# Patient Record
Sex: Female | Born: 1960 | Race: Black or African American | Hispanic: No | Marital: Single | Smoking: Never smoker
Health system: Southern US, Community
[De-identification: ages and names within clinical notes are randomized; demographics above are authoritative.]

## PROBLEM LIST (undated history)

## (undated) DIAGNOSIS — I10 Essential (primary) hypertension: Secondary | ICD-10-CM

## (undated) DIAGNOSIS — E119 Type 2 diabetes mellitus without complications: Secondary | ICD-10-CM

## (undated) DIAGNOSIS — E785 Hyperlipidemia, unspecified: Secondary | ICD-10-CM

## (undated) HISTORY — DX: Type 2 diabetes mellitus without complications: E11.9

## (undated) HISTORY — DX: Hyperlipidemia, unspecified: E78.5

## (undated) HISTORY — PX: CHOLECYSTECTOMY: SHX55

---

## 1997-08-31 ENCOUNTER — Ambulatory Visit (HOSPITAL_COMMUNITY): Admission: RE | Admit: 1997-08-31 | Discharge: 1997-08-31 | Payer: Self-pay | Admitting: Family Medicine

## 1999-12-11 ENCOUNTER — Other Ambulatory Visit: Admission: RE | Admit: 1999-12-11 | Discharge: 1999-12-11 | Payer: Self-pay | Admitting: Internal Medicine

## 1999-12-15 ENCOUNTER — Encounter: Payer: Self-pay | Admitting: Internal Medicine

## 1999-12-15 ENCOUNTER — Encounter: Admission: RE | Admit: 1999-12-15 | Discharge: 1999-12-15 | Payer: Self-pay | Admitting: Internal Medicine

## 2000-12-28 ENCOUNTER — Other Ambulatory Visit: Admission: RE | Admit: 2000-12-28 | Discharge: 2000-12-28 | Payer: Self-pay | Admitting: Internal Medicine

## 2001-06-13 ENCOUNTER — Encounter: Admission: RE | Admit: 2001-06-13 | Discharge: 2001-09-11 | Payer: Self-pay | Admitting: Internal Medicine

## 2001-06-14 ENCOUNTER — Ambulatory Visit (HOSPITAL_COMMUNITY): Admission: RE | Admit: 2001-06-14 | Discharge: 2001-06-14 | Payer: Self-pay | Admitting: Podiatry

## 2001-06-14 ENCOUNTER — Encounter: Payer: Self-pay | Admitting: Podiatry

## 2003-10-01 ENCOUNTER — Emergency Department (HOSPITAL_COMMUNITY): Admission: EM | Admit: 2003-10-01 | Discharge: 2003-10-01 | Payer: Self-pay | Admitting: Unknown Physician Specialty

## 2004-02-04 ENCOUNTER — Ambulatory Visit (HOSPITAL_COMMUNITY): Admission: RE | Admit: 2004-02-04 | Discharge: 2004-02-04 | Payer: Self-pay | Admitting: Podiatry

## 2004-08-16 ENCOUNTER — Encounter: Admission: RE | Admit: 2004-08-16 | Discharge: 2004-08-16 | Payer: Self-pay | Admitting: Internal Medicine

## 2004-09-16 ENCOUNTER — Encounter: Admission: RE | Admit: 2004-09-16 | Discharge: 2004-09-16 | Payer: Self-pay | Admitting: Specialist

## 2005-05-11 HISTORY — PX: BACK SURGERY: SHX140

## 2005-05-19 ENCOUNTER — Encounter: Admission: RE | Admit: 2005-05-19 | Discharge: 2005-05-19 | Payer: Self-pay | Admitting: Internal Medicine

## 2005-06-27 ENCOUNTER — Emergency Department (HOSPITAL_COMMUNITY): Admission: EM | Admit: 2005-06-27 | Discharge: 2005-06-27 | Payer: Self-pay | Admitting: Emergency Medicine

## 2005-07-20 ENCOUNTER — Encounter: Admission: RE | Admit: 2005-07-20 | Discharge: 2005-07-20 | Payer: Self-pay | Admitting: Gastroenterology

## 2006-01-20 ENCOUNTER — Ambulatory Visit (HOSPITAL_COMMUNITY): Admission: RE | Admit: 2006-01-20 | Discharge: 2006-01-21 | Payer: Self-pay | Admitting: Neurosurgery

## 2006-01-20 ENCOUNTER — Encounter (INDEPENDENT_AMBULATORY_CARE_PROVIDER_SITE_OTHER): Payer: Self-pay | Admitting: *Deleted

## 2006-10-12 ENCOUNTER — Ambulatory Visit (HOSPITAL_COMMUNITY): Admission: RE | Admit: 2006-10-12 | Discharge: 2006-10-12 | Payer: Self-pay | Admitting: Gastroenterology

## 2006-10-15 ENCOUNTER — Other Ambulatory Visit: Admission: RE | Admit: 2006-10-15 | Discharge: 2006-10-15 | Payer: Self-pay | Admitting: Obstetrics and Gynecology

## 2006-12-06 ENCOUNTER — Ambulatory Visit (HOSPITAL_COMMUNITY): Admission: RE | Admit: 2006-12-06 | Discharge: 2006-12-06 | Payer: Self-pay | Admitting: Obstetrics and Gynecology

## 2006-12-13 ENCOUNTER — Encounter (INDEPENDENT_AMBULATORY_CARE_PROVIDER_SITE_OTHER): Payer: Self-pay | Admitting: General Surgery

## 2006-12-13 ENCOUNTER — Ambulatory Visit (HOSPITAL_COMMUNITY): Admission: RE | Admit: 2006-12-13 | Discharge: 2006-12-13 | Payer: Self-pay | Admitting: General Surgery

## 2007-03-10 ENCOUNTER — Encounter: Admission: RE | Admit: 2007-03-10 | Discharge: 2007-03-10 | Payer: Self-pay | Admitting: Neurosurgery

## 2007-04-24 ENCOUNTER — Emergency Department (HOSPITAL_COMMUNITY): Admission: EM | Admit: 2007-04-24 | Discharge: 2007-04-24 | Payer: Self-pay | Admitting: Emergency Medicine

## 2008-08-04 ENCOUNTER — Emergency Department (HOSPITAL_COMMUNITY): Admission: EM | Admit: 2008-08-04 | Discharge: 2008-08-04 | Payer: Self-pay | Admitting: Emergency Medicine

## 2008-10-16 ENCOUNTER — Encounter: Admission: RE | Admit: 2008-10-16 | Discharge: 2008-10-16 | Payer: Self-pay | Admitting: Neurosurgery

## 2009-05-08 ENCOUNTER — Emergency Department (HOSPITAL_COMMUNITY): Admission: EM | Admit: 2009-05-08 | Discharge: 2009-05-08 | Payer: Self-pay | Admitting: Emergency Medicine

## 2009-05-15 ENCOUNTER — Encounter: Admission: RE | Admit: 2009-05-15 | Discharge: 2009-05-15 | Payer: Self-pay | Admitting: Neurosurgery

## 2010-08-21 LAB — DIFFERENTIAL
Basophils Absolute: 0 10*3/uL (ref 0.0–0.1)
Basophils Relative: 0 % (ref 0–1)
Eosinophils Absolute: 0.1 10*3/uL (ref 0.0–0.7)
Eosinophils Relative: 1 % (ref 0–5)
Lymphocytes Relative: 21 % (ref 12–46)
Lymphs Abs: 2 10*3/uL (ref 0.7–4.0)
Monocytes Absolute: 0.7 10*3/uL (ref 0.1–1.0)
Monocytes Relative: 8 % (ref 3–12)
Neutro Abs: 6.7 10*3/uL (ref 1.7–7.7)
Neutrophils Relative %: 71 % (ref 43–77)

## 2010-08-21 LAB — URINALYSIS, ROUTINE W REFLEX MICROSCOPIC
Bilirubin Urine: NEGATIVE
Glucose, UA: NEGATIVE mg/dL
Hgb urine dipstick: NEGATIVE
Ketones, ur: NEGATIVE mg/dL
Nitrite: NEGATIVE
Protein, ur: NEGATIVE mg/dL
Specific Gravity, Urine: 1.01 (ref 1.005–1.030)
Urobilinogen, UA: 1 mg/dL (ref 0.0–1.0)
pH: 6 (ref 5.0–8.0)

## 2010-08-21 LAB — CBC
HCT: 38.8 % (ref 36.0–46.0)
Hemoglobin: 13.1 g/dL (ref 12.0–15.0)
MCHC: 33.9 g/dL (ref 30.0–36.0)
MCV: 91.3 fL (ref 78.0–100.0)
Platelets: 268 10*3/uL (ref 150–400)
RBC: 4.26 MIL/uL (ref 3.87–5.11)
RDW: 13.4 % (ref 11.5–15.5)
WBC: 9.5 10*3/uL (ref 4.0–10.5)

## 2010-08-21 LAB — COMPREHENSIVE METABOLIC PANEL
ALT: 31 U/L (ref 0–35)
AST: 31 U/L (ref 0–37)
Albumin: 4 g/dL (ref 3.5–5.2)
Alkaline Phosphatase: 68 U/L (ref 39–117)
BUN: 20 mg/dL (ref 6–23)
CO2: 28 mEq/L (ref 19–32)
Calcium: 9.8 mg/dL (ref 8.4–10.5)
Chloride: 102 mEq/L (ref 96–112)
Creatinine, Ser: 0.83 mg/dL (ref 0.4–1.2)
GFR calc Af Amer: >60 mL/min (ref >60)
GFR calc non Af Amer: >60 mL/min (ref >60)
Glucose, Bld: 115 mg/dL — ABNORMAL HIGH (ref 70–99)
Potassium: 4.3 mEq/L (ref 3.5–5.1)
Sodium: 137 mEq/L (ref 135–145)
Total Bilirubin: 0.5 mg/dL (ref 0.3–1.2)
Total Protein: 7.4 g/dL (ref 6.0–8.3)

## 2010-08-21 LAB — POCT I-STAT, CHEM 8
BUN: 23 mg/dL (ref 6–23)
Calcium, Ion: 1.21 mmol/L (ref 1.12–1.32)
Chloride: 103 mEq/L (ref 96–112)
Creatinine, Ser: 0.9 mg/dL (ref 0.4–1.2)
Glucose, Bld: 111 mg/dL — ABNORMAL HIGH (ref 70–99)
HCT: 40 % (ref 36.0–46.0)
Hemoglobin: 13.6 g/dL (ref 12.0–15.0)
Potassium: 4.4 mEq/L (ref 3.5–5.1)
Sodium: 137 mEq/L (ref 135–145)
TCO2: 30 mmol/L (ref 0–100)

## 2010-08-21 LAB — PREGNANCY, URINE: Preg Test, Ur: NEGATIVE

## 2010-09-23 NOTE — Op Note (Signed)
Ashley Crane              ACCOUNT NO.:  [id number]   MEDICAL RECORD NO.:  [id number]          PATIENT TYPE:  AMB   LOCATION:  SDS                          FACILITY:  [facility]   PHYSICIAN:  Cherylynn Ridges, M.D.    DATE OF BIRTH:  14-Jan-1961   DATE OF PROCEDURE:  12/13/2006  DATE OF DISCHARGE:                               OPERATIVE REPORT   PREOPERATIVE DIAGNOSIS:  Symptomatic biliary dyskinesia.   POSTOPERATIVE DIAGNOSIS:  Symptomatic biliary dyskinesia.   PROCEDURE:  Laparoscopic cholecystectomy with intraoperative  cholangiogram.   SURGEON:  Cherylynn Ridges, M.D.   ASSISTANT:  Currie Paris, M.D.   ANESTHESIA:  General endotracheal.   ESTIMATED BLOOD LOSS:  Less than 20 mL.   COMPLICATIONS:  None.   CONDITION:  Stable.   FINDINGS:  The patient had some adhesions to the body and infundibulum  of the gallbladder indicative of some chronic inflammatory process.  The  cholangiogram was normal.   INDICATIONS FOR OPERATION:  The patient is a 50 year old with biliary  dyskinesia and symptoms, who now comes in for an elective laparoscopic  cholecystectomy.   OPERATION:  The patient was taken to the operating room and placed on  the table in supine position.  After an adequate endotracheal anesthetic  was administered, she was prepped and draped in the usual sterile  manner, exposing the midline in the right upper quadrant.   A supraumbilical curvilinear incision was made using a #11 blade.  It  was taken down to the midline fascia.  Once the fascia was identified, a  15 blade was used to make an incision in the fascia, and then  subsequently the edges grasped with Kocher clamps.  We then tented up on  the fascia, and then bluntly dissected down into the peritoneal cavity  using a Kelly clamp.  Once this was done, a Hasson cannula was passed  into the fascial opening after a pursestring suture of 0 Vicryl was  placed.  The pursestring suture held the Hasson cannula  in place as we  insufflated the abdomen with carbon dioxide gas up to a maximal intra-  abdominal pressure of 15 mmHg.   Once this was done, the patient was placed in reverse Trendelenburg, and  2 right costal margin 5-mm cannulae and a subxiphoid 12-mm cannula were  passed under direct vision.  Her left side was tilted down.   The dome of the gallbladder was grasped using a ratcheted grasper  through the lateral mass 5-mm cannula.  This exposed adhesions to the  body and infundibulum, which were bluntly and sharply taken down with a  dissector and cautery.  We exposed the peritoneum overlying the triangle  of Calot and the hepatoduodenal triangle.  Then we isolated the cystic  duct and the cystic artery.  A clip was placed along the gallbladder  side of the cystic duct.  Then a cholecystodochotomy made, through which  a Cook catheter with some difficulty was passed in order to perform a  cholangiogram.  The cholangiogram showed good flow into the duodenum,  good proximal filling, no ductal dilatation  and no filling defects.   We removed the clip securing the cholangiocatheter in place and clipped  the distal cystic duct times 2 and transected it.  The cholangiogram  also did show that the patient had a long cystic duct remnant.   The cystic duct and artery were identified and then endoclipped  proximally x2 and distally x2 and then transected.  There was a  posterior branch also, which was identified and clipped.  We dissected  off the gallbladder from its bed with minimal difficulty, then brought  it out through the supraumbilical fascial site with minimal difficulty  and without needing an EndoCatch bag.  All counts were correct at that  point.  We irrigated the gallbladder bed with saline.  There was minimal  bleeding.  We aspirated above the liver all gas and all fluids, and then  removed all cannulae.   The supraumbilical fascial site was closed using the pursestring suture,   which was in place.  We closed all skin sites using a running  subcuticular stitch of 4-0 Vicryl, with the exception of the lateral-  most cannula sites, which were closed with Dermabond.  Marcaine 0.25%  with epi was injected at all sites.  All needle counts, sponge counts  and instrument counts were correct.  Sterile dressings were applied.      Cherylynn Ridges, M.D.  Electronically Signed     JOW/MEDQ  D:  12/13/2006  T:  12/13/2006  Job:  [id number]   cc:   Ashley Crane, M.D.

## 2010-09-26 NOTE — Op Note (Signed)
Ashley Crane, Ashley Crane              ACCOUNT NO.:  [id number]   MEDICAL RECORD NO.:  [id number]          PATIENT TYPE:  AMB   LOCATION:  SDS                          FACILITY:  [facility]   PHYSICIAN:  Donalee Citrin, M.D.        DATE OF BIRTH:  1960-11-20   DATE OF PROCEDURE:  01/20/2006  DATE OF DISCHARGE:                                 OPERATIVE REPORT   PREOPERATIVE DIAGNOSIS:  Left L4-L5 radiculopathy from cystic mass, L4-5,  left.   PROCEDURES:  Decompressive laminectomy, L4-5, left, with microscopic  dissection of the left L4 and L5 nerve roots, and microscopic cystectomy.   SURGEON:  Donalee Citrin, M.D.   ASSISTANT:  Tia Alert, MD.   ANESTHESIA:  General endotracheal.   FINDINGS:  A large synovial cyst coming off the inferior aspect of the L4  nerve root on the superior aspect of the facet complex, displacing the L4  nerve root superiorly, the thecal sac medially, and extending down to the  proximal L5 nerve root inferiorly.   HISTORY OF PRESENT ILLNESS:  The patient is a very pleasant 50 year old  female, who sustained a work-related injury in a fall, and has had back and  severe left leg pain with radiculopathy down to the top of the foot and the  big toe, consistent with the L5 nerve root pattern.  The patient failed all  forms of conservative treatment with physical therapy, epidural steroid  injections.  Preoperative imaging was very unusual.  MRI scan showed a dark,  cystic-appearing mass in the proximal aspect of the left L4 foramen just  inferior to the pedicle.  This was further worked up with CT myelography,  which implied some communication with the spinal fluid.  This space was  filled with CSF and Omnipaque.  This was originally diagnosed as a  neurofibroma.  This subsequently underwent contrasted MRI scan, which did  not show any enhancement, so therefore felt not to be a neurofibroma, but  more likely represents either a synovial cyst or a perineural cyst.  Due  to  the patient's persistent symptoms and failure of conservative treatment, the  patient was recommended decompressive laminectomy, exploration and removal  of cyst, if possible.  The risks and benefits were thoroughly explained to  the patient.  She understood and agreed to proceed forward.   DESCRIPTION OF PROCEDURE:  The patient was brought to the OR and was induced  under general anesthesia and was positioned prone on the Wilson frame.  The  back was prepped and draped in the usual sterile fashion.  Preoperative x-  ray localized the L5-S1 interspace. So the incision was made superior to  this after infiltration of 10 cc lidocaine with epi, and Bovie  electrocautery was used to take the dissection down through the subcutaneous  tissues.  Subperiosteal dissection was carried out onto the laminae of L4  and L5 bilaterally.  A self-retaining retractor was placed.  Intraoperative  x-ray confirmed localization of the L4-5 disk space.  So, then the spinous  process at L4 was removed and central decompression was carried out  to gain  access around this cyst, with the idea that this may very well be a  perineural cyst.  We needed to have enough dural exposure to investigate,  explore and possibly excise and repair.  The laminotomy was carried up all  the way to the superior aspect of the L4 lamina.  The L4 pedicle was  identified.  The L4 nerve root was then identified coming off the pedicle.  The cyst was also palpated and expressed and seen coming off the inferior  aspect of the facet complex; so, at this point, the L5 nerve root was  identified off the L5 pedicle.  The disk space was identified.  The superior  and inferior margins of the cyst were identified.  The cyst was noted to be  kind of grayish in color, consistent with a synovial cyst.  A plane was  developed on the inferior aspect of the 4 root, as well as the lateral  aspect of the thecal sac, and this plane was developed with a  4 Penfield.  Then, it was removed, felt not to be coming off the 4 root, and no pedicle  on the thecal sac or dura was appreciated.  So, working along the 4 root  with the 2-mm Kerrison, on the inferior aspect of the 4 root, the cyst was  underbitten and the undersurface of the lateral facet complex was  underbitten.  The cyst contents were immediately expressed, and this was  noted to be a dark yellow, cottage cheese-appearing substance consistent  with a dystrophic or degenerated synovial cyst, and this was all removed in  piecemeal fashion.  The cyst was then completely excised and sent for final  pathology to confirm the characterization of synovial cyst.  The  undersurface of the facet was palpated and scraped, and all the cystic  membrane was dissected free.  Also, the undersurface of the thecal sac  laterally was also investigated.  There was noted to be a densely adherent  fibrous band.  It was unclear whether this represented some epidural veins,  or possibly residual cyst membrane; but it was densely adherent to the dura  with a small pedicle, and this was all teased away and bipolar'd.  The disk  space was identified and explored and the disk space was noted to be not  compressive.  Then, the wound was copiously irrigated.  Meticulous  hemostasis was maintained.  Gelfoam was laid on top of the dura.  Muscle and  fascia were reapproximated with interrupted Vicryl, and the skin was closed  with running 4-0 subcuticular. Benzoin and Steri-Strips were applied.  The  patient went to the recovery room in stable condition.  At the end of the  case, all sponge and needle counts were correct.           ______________________________  Donalee Citrin, M.D.     GC/MEDQ  D:  01/20/2006  T:  01/20/2006  Job:  [id number]

## 2010-09-27 ENCOUNTER — Inpatient Hospital Stay (INDEPENDENT_AMBULATORY_CARE_PROVIDER_SITE_OTHER)
Admission: RE | Admit: 2010-09-27 | Discharge: 2010-09-27 | Disposition: A | Discharge status: Home / Self Care | Payer: Self-pay | Source: Ambulatory Visit | Attending: Family Medicine | Admitting: Family Medicine

## 2010-09-27 DIAGNOSIS — S139XXA Sprain of joints and ligaments of unspecified parts of neck, initial encounter: Secondary | ICD-10-CM

## 2010-09-29 ENCOUNTER — Emergency Department (HOSPITAL_COMMUNITY): Payer: No Typology Code available for payment source

## 2010-09-29 ENCOUNTER — Emergency Department (HOSPITAL_COMMUNITY)
Admission: EM | Admit: 2010-09-29 | Discharge: 2010-09-29 | Disposition: A | Discharge status: Home / Self Care | Payer: No Typology Code available for payment source | Attending: Emergency Medicine | Admitting: Emergency Medicine

## 2010-09-29 DIAGNOSIS — M25519 Pain in unspecified shoulder: Secondary | ICD-10-CM | POA: Insufficient documentation

## 2010-09-29 DIAGNOSIS — E669 Obesity, unspecified: Secondary | ICD-10-CM | POA: Insufficient documentation

## 2010-09-29 DIAGNOSIS — I10 Essential (primary) hypertension: Secondary | ICD-10-CM | POA: Insufficient documentation

## 2010-09-29 DIAGNOSIS — M542 Cervicalgia: Secondary | ICD-10-CM | POA: Insufficient documentation

## 2010-09-29 DIAGNOSIS — R079 Chest pain, unspecified: Secondary | ICD-10-CM | POA: Insufficient documentation

## 2010-09-29 DIAGNOSIS — T148XXA Other injury of unspecified body region, initial encounter: Secondary | ICD-10-CM | POA: Insufficient documentation

## 2011-02-23 LAB — COMPREHENSIVE METABOLIC PANEL
ALT: 26
AST: 23
Albumin: 3.5
Alkaline Phosphatase: 59
BUN: 12
CO2: 24
Calcium: 9.3
Chloride: 105
Creatinine, Ser: 0.72
GFR calc Af Amer: >60
GFR calc non Af Amer: >60
Glucose, Bld: 141 — ABNORMAL HIGH
Potassium: 3.6
Sodium: 136
Total Bilirubin: 0.5
Total Protein: 6.8

## 2011-02-23 LAB — DIFFERENTIAL
Basophils Absolute: 0
Basophils Relative: 0
Eosinophils Absolute: 0.1
Eosinophils Relative: 1
Lymphocytes Relative: 30
Lymphs Abs: 2.1
Monocytes Absolute: 0.4
Monocytes Relative: 6
Neutro Abs: 4.4
Neutrophils Relative %: 63

## 2011-02-23 LAB — CBC
HCT: 33 — ABNORMAL LOW
Hemoglobin: 11.3 — ABNORMAL LOW
MCHC: 34.2
MCV: 90
Platelets: 301
RBC: 3.67 — ABNORMAL LOW
RDW: 13.8
WBC: 7

## 2011-02-23 LAB — HCG, SERUM, QUALITATIVE: Preg, Serum: NEGATIVE

## 2013-02-24 ENCOUNTER — Emergency Department (HOSPITAL_COMMUNITY)
Admission: EM | Admit: 2013-02-24 | Discharge: 2013-02-25 | Disposition: A | Discharge status: Home / Self Care | Payer: Medicare Other | Attending: Emergency Medicine | Admitting: Emergency Medicine

## 2013-02-24 ENCOUNTER — Emergency Department (HOSPITAL_COMMUNITY): Payer: Medicare Other

## 2013-02-24 ENCOUNTER — Encounter (HOSPITAL_COMMUNITY): Payer: Self-pay | Admitting: Emergency Medicine

## 2013-02-24 DIAGNOSIS — H53149 Visual discomfort, unspecified: Secondary | ICD-10-CM | POA: Insufficient documentation

## 2013-02-24 DIAGNOSIS — I10 Essential (primary) hypertension: Secondary | ICD-10-CM | POA: Insufficient documentation

## 2013-02-24 DIAGNOSIS — R51 Headache: Secondary | ICD-10-CM | POA: Insufficient documentation

## 2013-02-24 DIAGNOSIS — R519 Headache, unspecified: Secondary | ICD-10-CM

## 2013-02-24 HISTORY — DX: Essential (primary) hypertension: I10

## 2013-02-24 MED ORDER — DIPHENHYDRAMINE HCL 50 MG/ML IJ SOLN
25.0000 mg | Freq: Once | INTRAMUSCULAR | Status: AC
  Administered 2013-02-24: 25 mg via INTRAVENOUS
  Filled 2013-02-24: qty 1

## 2013-02-24 MED ORDER — SODIUM CHLORIDE 0.9 % IV BOLUS (SEPSIS)
1000.0000 mL | Freq: Once | INTRAVENOUS | Status: AC
  Administered 2013-02-24: 1000 mL via INTRAVENOUS

## 2013-02-24 MED ORDER — DEXAMETHASONE SODIUM PHOSPHATE 10 MG/ML IJ SOLN
10.0000 mg | Freq: Once | INTRAMUSCULAR | Status: AC
  Administered 2013-02-24: 10 mg via INTRAVENOUS
  Filled 2013-02-24: qty 1

## 2013-02-24 MED ORDER — METOCLOPRAMIDE HCL 5 MG/ML IJ SOLN
10.0000 mg | Freq: Once | INTRAMUSCULAR | Status: AC
  Administered 2013-02-24: 10 mg via INTRAVENOUS
  Filled 2013-02-24: qty 2

## 2013-02-24 NOTE — ED Notes (Signed)
Patient transported to CT 

## 2013-02-24 NOTE — ED Notes (Signed)
Pt presents w/ c/o HTN, +nausea, migraine headache since taking one of her sister's oxycodone on the September 30.  Pt reports not "feeling right" since ingestion of oxycodone.  Pt reports elevation in BP.

## 2013-02-24 NOTE — ED Provider Notes (Signed)
CSN: [id number]     Arrival date & time 02/24/13  2100 History   First MD Initiated Contact with Patient 02/24/13 2204     Chief Complaint  Patient presents with  . Hypertension   (Consider location/radiation/quality/duration/timing/severity/associated sxs/prior Treatment) Patient is a 52 y.o. female presenting with headaches.  Headache Pain location:  Frontal Quality:  Dull Radiates to:  Does not radiate Pain severity now: severe. Onset quality:  Gradual Duration: "this week" Timing:  Intermittent Progression:  Unchanged Chronicity:  New Context: bright light   Relieved by: excedrin. Worsened by:  Light Associated symptoms: photophobia   Associated symptoms: no abdominal pain, no blurred vision, no congestion, no cough, no diarrhea, no fever, no nausea and no vomiting     Past Medical History  Diagnosis Date  . Hypertension    Past Surgical History  Procedure Laterality Date  . Back surgery  2007  . Cholecystectomy     No family history on file. History  Substance Use Topics  . Smoking status: Never Smoker   . Smokeless tobacco: Not on file  . Alcohol Use: No   OB History   Grav Para Term Preterm Abortions TAB SAB Ect Mult Living                 Review of Systems  Constitutional: Negative for fever.  HENT: Negative for congestion.   Eyes: Positive for photophobia. Negative for blurred vision.  Respiratory: Negative for cough and shortness of breath.   Cardiovascular: Negative for chest pain.  Gastrointestinal: Negative for nausea, vomiting, abdominal pain and diarrhea.  Neurological: Positive for headaches.  All other systems reviewed and are negative.    Allergies  Review of patient's allergies indicates not on file.  Home Medications  No current outpatient prescriptions on file. BP 159/81  Pulse 75  Temp(Src) 98.9 F (37.2 C) (Oral)  Resp 20  SpO2 100% Physical Exam  Nursing note and vitals reviewed. Constitutional: She is oriented to  person, place, and time. She appears well-developed and well-nourished. No distress.  HENT:  Head: Normocephalic and atraumatic.  Mouth/Throat: Oropharynx is clear and moist.  Eyes: Conjunctivae are normal. Pupils are equal, round, and reactive to light. No scleral icterus.  Neck: Neck supple.  Cardiovascular: Normal rate, regular rhythm, normal heart sounds and intact distal pulses.   No murmur heard. Pulmonary/Chest: Effort normal and breath sounds normal. No stridor. No respiratory distress. She has no rales.  Abdominal: Soft. Bowel sounds are normal. She exhibits no distension. There is no tenderness.  Musculoskeletal: Normal range of motion.  Neurological: She is alert and oriented to person, place, and time. She has normal strength. No cranial nerve deficit or sensory deficit. Coordination and gait normal. GCS eye subscore is 4. GCS verbal subscore is 5. GCS motor subscore is 6.  Skin: Skin is warm and dry. No rash noted.  Psychiatric: She has a normal mood and affect. Her behavior is normal.    ED Course  Procedures (including critical care time) Labs Review Labs Reviewed - No data to display Imaging Review Ct Head Wo Contrast  02/24/2013   CLINICAL DATA:  Frontal headache  EXAM: CT HEAD WITHOUT CONTRAST  TECHNIQUE: Contiguous axial images were obtained from the base of the skull through the vertex without intravenous contrast.  COMPARISON:  Prior MRI from 05/15/2009.  FINDINGS: There is no acute intracranial hemorrhage or infarct. No mass or midline shift. CSF containing spaces are within normal limits. No hydrocephalus. No extra-axial fluid collection. Calvarium  is intact. Orbits are normal.  Paranasal sinuses and mastoid air cells are clear.  IMPRESSION: No acute intracranial process.   Electronically Signed   By: Rise Mu M.D.   On: 02/24/2013 23:20  All radiology studies independently viewed by me.     EKG Interpretation   None       MDM   1. Headache    52  yo female complaining of intermittent headache all week.  Described as gradual onset.  No fevers, no neck supple.  Normal neuro exam.  She states headaches are atypical for her, so will check CT.  IV metoclopramide, diphenhydramine, dexamethasone, and fluids for symptoms.    Symptoms resolved.  Head CT negative.  Feel that patient is safe for discharge.  Return precautions given and PCP followup advised.      Candyce Churn, MD 02/25/13 [phone]

## 2014-06-11 ENCOUNTER — Other Ambulatory Visit: Payer: Self-pay | Admitting: Internal Medicine

## 2014-06-11 ENCOUNTER — Ambulatory Visit
Admission: RE | Admit: 2014-06-11 | Discharge: 2014-06-11 | Disposition: A | Discharge status: Home / Self Care | Payer: 59 | Source: Ambulatory Visit | Attending: Internal Medicine | Admitting: Internal Medicine

## 2014-06-11 DIAGNOSIS — M25551 Pain in right hip: Secondary | ICD-10-CM

## 2014-07-11 ENCOUNTER — Other Ambulatory Visit: Payer: Self-pay | Admitting: Internal Medicine

## 2014-07-11 DIAGNOSIS — R1031 Right lower quadrant pain: Secondary | ICD-10-CM

## 2014-07-18 ENCOUNTER — Ambulatory Visit
Admission: RE | Admit: 2014-07-18 | Discharge: 2014-07-18 | Disposition: A | Discharge status: Home / Self Care | Payer: 59 | Source: Ambulatory Visit | Attending: Internal Medicine | Admitting: Internal Medicine

## 2014-07-18 DIAGNOSIS — R1031 Right lower quadrant pain: Secondary | ICD-10-CM

## 2014-07-18 MED ORDER — IOPAMIDOL (ISOVUE-300) INJECTION 61%
125.0000 mL | Freq: Once | INTRAVENOUS | Status: AC | PRN
  Administered 2014-07-18: 125 mL via INTRAVENOUS

## 2014-08-01 ENCOUNTER — Encounter: Payer: Medicare HMO | Attending: Internal Medicine | Admitting: *Deleted

## 2014-08-01 ENCOUNTER — Encounter: Payer: Self-pay | Admitting: *Deleted

## 2014-08-01 VITALS — Ht 64.0 in | Wt 201.1 lb

## 2014-08-01 DIAGNOSIS — Z713 Dietary counseling and surveillance: Secondary | ICD-10-CM | POA: Diagnosis not present

## 2014-08-01 DIAGNOSIS — E119 Type 2 diabetes mellitus without complications: Secondary | ICD-10-CM | POA: Diagnosis not present

## 2014-08-01 NOTE — Progress Notes (Signed)
Diabetes Self-Management Education  Visit Type:  DSME  Appt. Start Time: 0800 Appt. End Time: 0930  08/01/2014  Ms. Ashley Crane, identified by name and date of birth, is a 54 y.o. female with a diagnosis of Diabetes: Type 2.  Other people present during visit:  Patient . Ms. Cheese lives with her brother and god-brother. She does most of the shopping and cooking. Visit notes from 07/09/14 indicate RX of Atorvastatin. Patient states it was not RX'd and is not at pharmacy.  ASSESSMENT  Height  (1.626 m), weight 201 lb 1.6 oz (91.218 kg). Body mass index is 34.5 kg/(m^2).  Initial Visit Information:  Are you currently following a meal plan?: No Are you taking your medications as prescribed?: Yes Are you checking your feet?: No How often do you need to have someone help you when you read instructions, pamphlets, or other written materials from your doctor or pharmacy?: 1 - Never   Psychosocial:   Patient Belief/Attitude about Diabetes: Motivated to manage diabetes Self-care barriers: None Self-management support: Doctor's office, Family, CDE visits Other persons present: Patient Patient Concerns: Nutrition/Meal planning, Healthy Lifestyle Special Needs: None Preferred Learning Style: No preference indicated Learning Readiness: Change in progress  Complications:   Last HgB A1C per patient/outside source: 12 mg/dL How often do you check your blood sugar?: 1-2 times/day Fasting Blood glucose range (mg/dL): >409 (Range 811-914 / has had medicataion change recently to address hyperglycemia) Have you had a dilated eye exam in the past 12 months?: Yes Have you had a dental exam in the past 12 months?: No  Diet Intake:  Breakfast: 3/4 C grits, turkey/pork bacon, toast strawberry jelly, fried egg (egg whites), water Lunch: cabbage, meatoaf, rice, / [place] Sub from [employer] , unsweet Tea with splenda Snack (afternoon): potato chips, kettle chips,/ [place] protein bar ,  peanuts, fig newton fiber bar Dinner: zucchini, onions, chicken,  Snack (evening): [place] salad, Ritz crackers #6, peanuts Beverage(s): water, coffee,  Exercise:  Exercise: ADL's  Individualized Plan for Diabetes Self-Management Training:   Learning Objective:  Patient will have a greater understanding of diabetes self-management. Patient education plan per assessed needs and concerns is to attend individual sessions     Education Topics Reviewed with Patient Today:  Definition of diabetes, type 1 and 2, and the diagnosis of diabetes, Factors that contribute to the development of diabetes Role of diet in the treatment of diabetes and the relationship between the three main macronutrients and blood glucose level, Food label reading, portion sizes and measuring food., Carbohydrate counting, Information on hints to eating out and maintain blood glucose control., Meal options for control of blood glucose level and chronic complications. Role of exercise on diabetes management, blood pressure control and cardiac health. Purpose and frequency of SMBG., Yearly dilated eye exam, Daily foot exams Identified and addressed patients feelings and concerns about diabetes  PATIENTS GOALS/Plan (Developed by the patient):  Nutrition: General guidelines for healthy choices and portions discussed Physical Activity: Exercise 3-5 times per week, 30 minutes per day Medications: take my medication as prescribed   Patient Instructions  Plan:  Aim for 2-3 Carb Choices per meal (30-45 grams) +/- 1 either way  Aim for 0-15 Carbs per snack if hungry  Include protein in moderation with your meals and snacks Consider reading food labels for Total Carbohydrate and Fat Grams of foods Consider  increasing your activity level by exercise for 30 minutes daily as tolerated Consider checking BG at alternate times per day as  directed by MD  continue taking medication as directed by MD  Butter substitute: [employer]  [employer] PasseyBrown, I Can't believe it's not butter Olive Oil is your best choice Diet [employer]a - NO calories, caffeine, sodium / Diet GingerAle Terrill MohrSara Lee Reduced calorie bread also reduces the amount of carbohydrates Do not get processed cheese - get real cheese from [employer] Counter Consider egg whites or EggBeaters instead of whole eggs  Check with Dr. Renae GlossShelton about Atorvastatin 40mg  1 daily    Expected Outcomes:  Demonstrated interest in learning. Expect positive outcomes  Education material provided: Living Well with Diabetes, Food planning card, My Plate, Snack sheet  If problems or questions, patient to contact team via:  Phone  Future DSME appointment: PRN

## 2014-08-01 NOTE — Patient Instructions (Addendum)
Plan:  Aim for 2-3 Carb Choices per meal (30-45 grams) +/- 1 either way  Aim for 0-15 Carbs per snack if hungry  Include protein in moderation with your meals and snacks Consider reading food labels for Total Carbohydrate and Fat Grams of foods Consider  increasing your activity level by exercise for 30 minutes daily as tolerated Consider checking BG at alternate times per day as directed by MD  continue taking medication as directed by MD  Butter substitute: Brummel & [place] PasseyBrown, I Can't believe it's not butter Olive Oil is your best choice Diet R[employer] Cola - NO calories, caffeine, sodium / Diet GingerAle Terrill MohrSara Lee Reduced calorie bread also reduces the amount of carbohydrates Do not get processed cheese - get real cheese from [employer] Counter Consider egg whites or EggBeaters instead of whole eggs  Check with Dr. Renae GlossShelton about Atorvastatin 40mg  1 daily

## 2015-02-19 DIAGNOSIS — H2513 Age-related nuclear cataract, bilateral: Secondary | ICD-10-CM | POA: Diagnosis not present

## 2015-02-19 DIAGNOSIS — E119 Type 2 diabetes mellitus without complications: Secondary | ICD-10-CM | POA: Diagnosis not present

## 2015-02-19 DIAGNOSIS — H04123 Dry eye syndrome of bilateral lacrimal glands: Secondary | ICD-10-CM | POA: Diagnosis not present

## 2015-04-18 DIAGNOSIS — E1165 Type 2 diabetes mellitus with hyperglycemia: Secondary | ICD-10-CM | POA: Diagnosis not present

## 2015-04-18 DIAGNOSIS — E785 Hyperlipidemia, unspecified: Secondary | ICD-10-CM | POA: Diagnosis not present

## 2015-04-18 DIAGNOSIS — Z6839 Body mass index (BMI) 39.0-39.9, adult: Secondary | ICD-10-CM | POA: Diagnosis not present

## 2015-04-18 DIAGNOSIS — I10 Essential (primary) hypertension: Secondary | ICD-10-CM | POA: Diagnosis not present

## 2015-04-19 DIAGNOSIS — E784 Other hyperlipidemia: Secondary | ICD-10-CM | POA: Diagnosis not present

## 2015-04-19 DIAGNOSIS — E1165 Type 2 diabetes mellitus with hyperglycemia: Secondary | ICD-10-CM | POA: Diagnosis not present

## 2015-04-19 DIAGNOSIS — I10 Essential (primary) hypertension: Secondary | ICD-10-CM | POA: Diagnosis not present

## 2015-04-22 DIAGNOSIS — Z1231 Encounter for screening mammogram for malignant neoplasm of breast: Secondary | ICD-10-CM | POA: Diagnosis not present

## 2015-07-09 ENCOUNTER — Encounter (INDEPENDENT_AMBULATORY_CARE_PROVIDER_SITE_OTHER): Payer: Self-pay

## 2015-07-18 DIAGNOSIS — E784 Other hyperlipidemia: Secondary | ICD-10-CM | POA: Diagnosis not present

## 2015-07-18 DIAGNOSIS — E785 Hyperlipidemia, unspecified: Secondary | ICD-10-CM | POA: Diagnosis not present

## 2015-07-18 DIAGNOSIS — I10 Essential (primary) hypertension: Secondary | ICD-10-CM | POA: Diagnosis not present

## 2015-07-18 DIAGNOSIS — E119 Type 2 diabetes mellitus without complications: Secondary | ICD-10-CM | POA: Diagnosis not present

## 2015-07-18 DIAGNOSIS — E1165 Type 2 diabetes mellitus with hyperglycemia: Secondary | ICD-10-CM | POA: Diagnosis not present

## 2015-07-18 DIAGNOSIS — Z6839 Body mass index (BMI) 39.0-39.9, adult: Secondary | ICD-10-CM | POA: Diagnosis not present

## 2015-10-31 DIAGNOSIS — M5430 Sciatica, unspecified side: Secondary | ICD-10-CM | POA: Diagnosis not present

## 2015-10-31 DIAGNOSIS — E1165 Type 2 diabetes mellitus with hyperglycemia: Secondary | ICD-10-CM | POA: Diagnosis not present

## 2015-10-31 DIAGNOSIS — Z23 Encounter for immunization: Secondary | ICD-10-CM | POA: Diagnosis not present

## 2015-10-31 DIAGNOSIS — Z0000 Encounter for general adult medical examination without abnormal findings: Secondary | ICD-10-CM | POA: Diagnosis not present

## 2015-10-31 DIAGNOSIS — E785 Hyperlipidemia, unspecified: Secondary | ICD-10-CM | POA: Diagnosis not present

## 2015-10-31 DIAGNOSIS — I10 Essential (primary) hypertension: Secondary | ICD-10-CM | POA: Diagnosis not present

## 2015-11-06 DIAGNOSIS — E784 Other hyperlipidemia: Secondary | ICD-10-CM | POA: Diagnosis not present

## 2015-11-06 DIAGNOSIS — E559 Vitamin D deficiency, unspecified: Secondary | ICD-10-CM | POA: Diagnosis not present

## 2015-11-06 DIAGNOSIS — Z0000 Encounter for general adult medical examination without abnormal findings: Secondary | ICD-10-CM | POA: Diagnosis not present

## 2015-11-06 DIAGNOSIS — E1165 Type 2 diabetes mellitus with hyperglycemia: Secondary | ICD-10-CM | POA: Diagnosis not present

## 2016-01-17 DIAGNOSIS — N390 Urinary tract infection, site not specified: Secondary | ICD-10-CM | POA: Diagnosis not present

## 2016-02-03 DIAGNOSIS — E1165 Type 2 diabetes mellitus with hyperglycemia: Secondary | ICD-10-CM | POA: Diagnosis not present

## 2016-02-03 DIAGNOSIS — E785 Hyperlipidemia, unspecified: Secondary | ICD-10-CM | POA: Diagnosis not present

## 2016-02-03 DIAGNOSIS — I10 Essential (primary) hypertension: Secondary | ICD-10-CM | POA: Diagnosis not present

## 2016-02-25 ENCOUNTER — Encounter (HOSPITAL_COMMUNITY): Payer: Self-pay | Admitting: *Deleted

## 2016-02-25 ENCOUNTER — Emergency Department (HOSPITAL_COMMUNITY)
Admission: EM | Admit: 2016-02-25 | Discharge: 2016-02-25 | Disposition: A | Discharge status: Home / Self Care | Payer: Medicare HMO | Attending: Emergency Medicine | Admitting: Emergency Medicine

## 2016-02-25 DIAGNOSIS — I10 Essential (primary) hypertension: Secondary | ICD-10-CM | POA: Diagnosis not present

## 2016-02-25 DIAGNOSIS — H04123 Dry eye syndrome of bilateral lacrimal glands: Secondary | ICD-10-CM | POA: Diagnosis not present

## 2016-02-25 DIAGNOSIS — E119 Type 2 diabetes mellitus without complications: Secondary | ICD-10-CM | POA: Insufficient documentation

## 2016-02-25 DIAGNOSIS — M25511 Pain in right shoulder: Secondary | ICD-10-CM | POA: Diagnosis not present

## 2016-02-25 DIAGNOSIS — Z7982 Long term (current) use of aspirin: Secondary | ICD-10-CM | POA: Diagnosis not present

## 2016-02-25 DIAGNOSIS — G8929 Other chronic pain: Secondary | ICD-10-CM | POA: Diagnosis not present

## 2016-02-25 DIAGNOSIS — H2513 Age-related nuclear cataract, bilateral: Secondary | ICD-10-CM | POA: Diagnosis not present

## 2016-02-25 DIAGNOSIS — H40013 Open angle with borderline findings, low risk, bilateral: Secondary | ICD-10-CM | POA: Diagnosis not present

## 2016-02-25 DIAGNOSIS — Z7984 Long term (current) use of oral hypoglycemic drugs: Secondary | ICD-10-CM | POA: Insufficient documentation

## 2016-02-25 DIAGNOSIS — Z79899 Other long term (current) drug therapy: Secondary | ICD-10-CM | POA: Insufficient documentation

## 2016-02-25 MED ORDER — ACETAMINOPHEN 500 MG PO TABS
1000.0000 mg | ORAL_TABLET | Freq: Once | ORAL | Status: AC
  Administered 2016-02-25: 1000 mg via ORAL
  Filled 2016-02-25: qty 2

## 2016-02-25 MED ORDER — ACETAMINOPHEN 500 MG PO TABS: 1000.0000 mg | ORAL_TABLET | Freq: Three times a day (TID) | ORAL | 0 refills | Status: AC

## 2016-02-25 NOTE — ED Triage Notes (Signed)
Patient c/o right shoulder pain x3 days and hx of same.  Patient has been told she has a bone spur in that shoulder, as well as rotator cuff injury.  Patient fell in 2007 and injured shoulder.  She discussed rotator cuff surgery with orthopedist, but that surgery was never done.  Patient has had intermittent steroid injections which occasionally helps.  Patient went to orthopedist today, but could not be seen.  She has an appt for Monday.  Patient has +2 radial pulse in RUE.  Patient has full ROM in RUE, but it increases pain.

## 2016-02-25 NOTE — ED Provider Notes (Signed)
[facility]-EMERGENCY DEPT Provider Note   CSN: [id number] Arrival date & time: 02/25/16  1735     History   Chief Complaint Chief Complaint  Patient presents with  . Shoulder Pain    HPI [name] is a 55 y.o. female.  HPI 55 year old female with a history of hypertension, hyperlipidemia, diabetes, chronic right shoulder pain secondary to what she reports that the bursitis presents ED with recurrence of her chronic right shoulder pain for approximately 3-4 weeks. Pain is been persistent and aggravated with ranging the shoulder and palpation. Patient has required steroid injections for pain relief several years ago. Was followed up by orthopedic surgery however pain had improved following that. Currently has an appointment with orthopedic surgery for next week. Patient has been placing Salon Pas with lidocaine which provides some relief. Patient denies any recent falls or trauma. Currently denies any chest pain, shortness of breath, nausea or vomiting. She denies any numbness, tingling, weakness of the right upper extremity.  Past Medical History:  Diagnosis Date  . Diabetes mellitus without complication (HCC)   . Hyperlipidemia   . Hypertension     There are no active problems to display for this patient.   Past Surgical History:  Procedure Laterality Date  . BACK SURGERY  2007  . CHOLECYSTECTOMY      OB History    No data available       Home Medications    Prior to Admission medications   Medication Sig Start Date End Date Taking? Authorizing Provider  acetaminophen (TYLENOL) 500 MG tablet Take 2 tablets (1,000 mg total) by mouth every 8 (eight) hours. Do not take more than 4000 mg of acetaminophen (Tylenol) in a 24-hour period. Please note that other medicines that you may be prescribed may have Tylenol as well. 02/25/16 03/01/16  Nira ConnPedro Eduardo Lashica Hannay, MD  aspirin-acetaminophen-caffeine (EXCEDRIN MIGRAINE) [id number] MG per tablet Take 1 tablet by mouth every 6  (six) hours as needed for pain.    Historical Provider, MD  atorvastatin (LIPITOR) 40 MG tablet Take 40 mg by mouth daily.    Historical Provider, MD  canagliflozin (INVOKANA) 300 MG TABS tablet Take 300 mg by mouth daily before breakfast.    Historical Provider, MD  metFORMIN (GLUCOPHAGE) 500 MG tablet Take 500 mg by mouth 2 (two) times daily with a meal.    Historical Provider, MD  PRESCRIPTION MEDICATION Take 1 tablet by mouth daily. Lisinopril HCTZ 10/ 25 mg    Historical Provider, MD  valsartan-hydrochlorothiazide (DIOVAN-HCT) 320-25 MG per tablet Take 1 tablet by mouth daily.    Historical Provider, MD    Family History Family History  Problem Relation Age of Onset  . Hyperlipidemia Other   . Hypertension Other   . Diabetes Other   . Heart disease Other   . Sleep apnea Other     Social History Social History  Substance Use Topics  . Smoking status: Never Smoker  . Smokeless tobacco: Never Used  . Alcohol use No     Allergies   Oxycodone hcl   Review of Systems Review of Systems Ten systems are reviewed and are negative for acute change except as noted in the HPI   Physical Exam Updated Vital Signs BP 153/75 (BP Location: Left Arm)   Pulse (!) 56   Temp 98 F (36.7 C) (Oral)   Resp 16   Ht 5\' 4"  (1.626 m)   Wt 192 lb (87.1 kg)   SpO2 99%   BMI 32.96 kg/m  Physical Exam  Constitutional: She is oriented to person, place, and time. She appears well-developed and well-nourished. No distress.  HENT:  Head: Normocephalic and atraumatic.  Right Ear: External ear normal.  Left Ear: External ear normal.  Nose: Nose normal.  Eyes: Conjunctivae and EOM are normal. No scleral icterus.  Neck: Normal range of motion and phonation normal.  Cardiovascular: Normal rate and regular rhythm.   Pulmonary/Chest: Effort normal. No stridor. No respiratory distress.  Abdominal: She exhibits no distension.  Musculoskeletal: Normal range of motion. She exhibits no edema.        Right shoulder: She exhibits tenderness. She exhibits normal range of motion, no swelling, no effusion, no crepitus, no deformity, no laceration, no pain and no spasm.       Arms: Neurological: She is alert and oriented to person, place, and time.  Skin: She is not diaphoretic.  Psychiatric: She has a normal mood and affect. Her behavior is normal.  Vitals reviewed.    ED Treatments / Results  Labs (all labs ordered are listed, but only abnormal results are displayed) Labs Reviewed - No data to display  EKG  EKG Interpretation None       Radiology No results found.  Procedures Procedures (including critical care time)  Medications Ordered in ED Medications  acetaminophen (TYLENOL) tablet 1,000 mg (1,000 mg Oral Given 02/25/16 2116)     Initial Impression / Assessment and Plan / ED Course  I have reviewed the triage vital signs and the nursing notes.  Pertinent labs & imaging results that were available during my care of the patient were reviewed by me and considered in my medical decision making (see chart for details).  Clinical Course    Low suspicion for septic arthritis.No recent trauma to suggest rotator cuff injury. Likely exacerbation of patient's prior bursitis. No indication for imaging at this time. Patient provided with by mouth Tylenol.  She already has follow-up with orthopedic surgery was instructed to keep this appointment.  Safe for discharge with strict return precautions.  Final Clinical Impressions(s) / ED Diagnoses   Final diagnoses:  Chronic right shoulder pain   Disposition: Discharge  Condition: Good  I have discussed the results, Dx and Tx plan with the patient who expressed understanding and agree(s) with the plan. Discharge instructions discussed at great length. The patient was given strict return precautions who verbalized understanding of the instructions. No further questions at time of discharge.    Discharge Medication List  as of 02/25/2016 10:06 PM    START taking these medications   Details  acetaminophen (TYLENOL) 500 MG tablet Take 2 tablets (1,000 mg total) by mouth every 8 (eight) hours. Do not take more than 4000 mg of acetaminophen (Tylenol) in a 24-hour period. Please note that other medicines that you may be prescribed may have Tylenol as well., Starting Tue 02/25/2016,  Until Sun 03/01/2016, Print        Follow Up: Ortho  On 03/02/2016 as scheduled  Andi Devon, MD [address] [place] [address] [phone]  Call  As needed      Nira Conn, MD 02/26/16 [phone]

## 2016-02-27 DIAGNOSIS — R0989 Other specified symptoms and signs involving the circulatory and respiratory systems: Secondary | ICD-10-CM | POA: Diagnosis not present

## 2016-02-27 DIAGNOSIS — I10 Essential (primary) hypertension: Secondary | ICD-10-CM | POA: Diagnosis not present

## 2016-02-27 DIAGNOSIS — E7800 Pure hypercholesterolemia, unspecified: Secondary | ICD-10-CM | POA: Diagnosis not present

## 2016-02-27 DIAGNOSIS — R0609 Other forms of dyspnea: Secondary | ICD-10-CM | POA: Diagnosis not present

## 2016-03-02 ENCOUNTER — Ambulatory Visit (INDEPENDENT_AMBULATORY_CARE_PROVIDER_SITE_OTHER): Payer: Self-pay | Admitting: Orthopaedic Surgery

## 2016-03-02 DIAGNOSIS — R69 Illness, unspecified: Secondary | ICD-10-CM | POA: Diagnosis not present

## 2016-03-03 DIAGNOSIS — Z23 Encounter for immunization: Secondary | ICD-10-CM | POA: Diagnosis not present

## 2016-03-04 DIAGNOSIS — M25511 Pain in right shoulder: Secondary | ICD-10-CM | POA: Diagnosis not present

## 2016-03-04 DIAGNOSIS — M7541 Impingement syndrome of right shoulder: Secondary | ICD-10-CM | POA: Diagnosis not present

## 2016-03-05 DIAGNOSIS — I10 Essential (primary) hypertension: Secondary | ICD-10-CM | POA: Diagnosis not present

## 2016-03-05 DIAGNOSIS — E1165 Type 2 diabetes mellitus with hyperglycemia: Secondary | ICD-10-CM | POA: Diagnosis not present

## 2016-03-05 DIAGNOSIS — M25511 Pain in right shoulder: Secondary | ICD-10-CM | POA: Diagnosis not present

## 2016-03-06 DIAGNOSIS — R0602 Shortness of breath: Secondary | ICD-10-CM | POA: Diagnosis not present

## 2016-03-06 DIAGNOSIS — Z8249 Family history of ischemic heart disease and other diseases of the circulatory system: Secondary | ICD-10-CM | POA: Diagnosis not present

## 2016-03-24 DIAGNOSIS — R0602 Shortness of breath: Secondary | ICD-10-CM | POA: Diagnosis not present

## 2016-03-24 DIAGNOSIS — Z8249 Family history of ischemic heart disease and other diseases of the circulatory system: Secondary | ICD-10-CM | POA: Diagnosis not present

## 2016-03-30 DIAGNOSIS — Z6835 Body mass index (BMI) 35.0-35.9, adult: Secondary | ICD-10-CM | POA: Diagnosis not present

## 2016-03-30 DIAGNOSIS — I10 Essential (primary) hypertension: Secondary | ICD-10-CM | POA: Diagnosis not present

## 2016-03-30 DIAGNOSIS — Z794 Long term (current) use of insulin: Secondary | ICD-10-CM | POA: Diagnosis not present

## 2016-03-30 DIAGNOSIS — Z0000 Encounter for general adult medical examination without abnormal findings: Secondary | ICD-10-CM | POA: Diagnosis not present

## 2016-03-31 DIAGNOSIS — I10 Essential (primary) hypertension: Secondary | ICD-10-CM | POA: Diagnosis not present

## 2016-03-31 DIAGNOSIS — R0609 Other forms of dyspnea: Secondary | ICD-10-CM | POA: Diagnosis not present

## 2016-03-31 DIAGNOSIS — R0989 Other specified symptoms and signs involving the circulatory and respiratory systems: Secondary | ICD-10-CM | POA: Diagnosis not present

## 2016-03-31 DIAGNOSIS — E7800 Pure hypercholesterolemia, unspecified: Secondary | ICD-10-CM | POA: Diagnosis not present

## 2016-04-13 DIAGNOSIS — E1165 Type 2 diabetes mellitus with hyperglycemia: Secondary | ICD-10-CM | POA: Diagnosis not present

## 2016-04-13 DIAGNOSIS — I10 Essential (primary) hypertension: Secondary | ICD-10-CM | POA: Diagnosis not present

## 2016-04-13 DIAGNOSIS — M25511 Pain in right shoulder: Secondary | ICD-10-CM | POA: Diagnosis not present

## 2016-04-27 DIAGNOSIS — Z1231 Encounter for screening mammogram for malignant neoplasm of breast: Secondary | ICD-10-CM | POA: Diagnosis not present

## 2016-05-29 DIAGNOSIS — M7541 Impingement syndrome of right shoulder: Secondary | ICD-10-CM | POA: Diagnosis not present

## 2016-05-29 DIAGNOSIS — M25511 Pain in right shoulder: Secondary | ICD-10-CM | POA: Diagnosis not present

## 2016-05-29 DIAGNOSIS — G8929 Other chronic pain: Secondary | ICD-10-CM | POA: Diagnosis not present

## 2016-06-09 DIAGNOSIS — E785 Hyperlipidemia, unspecified: Secondary | ICD-10-CM | POA: Diagnosis not present

## 2016-06-09 DIAGNOSIS — Z6839 Body mass index (BMI) 39.0-39.9, adult: Secondary | ICD-10-CM | POA: Diagnosis not present

## 2016-06-09 DIAGNOSIS — E1165 Type 2 diabetes mellitus with hyperglycemia: Secondary | ICD-10-CM | POA: Diagnosis not present

## 2016-06-09 DIAGNOSIS — I10 Essential (primary) hypertension: Secondary | ICD-10-CM | POA: Diagnosis not present

## 2016-06-10 DIAGNOSIS — I10 Essential (primary) hypertension: Secondary | ICD-10-CM | POA: Diagnosis not present

## 2016-06-10 DIAGNOSIS — E1165 Type 2 diabetes mellitus with hyperglycemia: Secondary | ICD-10-CM | POA: Diagnosis not present

## 2016-06-10 DIAGNOSIS — E785 Hyperlipidemia, unspecified: Secondary | ICD-10-CM | POA: Diagnosis not present

## 2016-06-30 DIAGNOSIS — G8929 Other chronic pain: Secondary | ICD-10-CM | POA: Diagnosis not present

## 2016-06-30 DIAGNOSIS — M7541 Impingement syndrome of right shoulder: Secondary | ICD-10-CM | POA: Diagnosis not present

## 2016-06-30 DIAGNOSIS — M25511 Pain in right shoulder: Secondary | ICD-10-CM | POA: Diagnosis not present

## 2016-07-01 ENCOUNTER — Encounter (HOSPITAL_COMMUNITY): Payer: Self-pay | Admitting: *Deleted

## 2016-07-01 ENCOUNTER — Emergency Department (HOSPITAL_COMMUNITY): Payer: Self-pay

## 2016-07-01 ENCOUNTER — Emergency Department (HOSPITAL_COMMUNITY)
Admission: EM | Admit: 2016-07-01 | Discharge: 2016-07-01 | Disposition: A | Discharge status: Home / Self Care | Payer: Self-pay | Attending: Emergency Medicine | Admitting: Emergency Medicine

## 2016-07-01 DIAGNOSIS — W0110XA Fall on same level from slipping, tripping and stumbling with subsequent striking against unspecified object, initial encounter: Secondary | ICD-10-CM | POA: Insufficient documentation

## 2016-07-01 DIAGNOSIS — Z7982 Long term (current) use of aspirin: Secondary | ICD-10-CM | POA: Insufficient documentation

## 2016-07-01 DIAGNOSIS — I10 Essential (primary) hypertension: Secondary | ICD-10-CM | POA: Insufficient documentation

## 2016-07-01 DIAGNOSIS — Y9389 Activity, other specified: Secondary | ICD-10-CM | POA: Insufficient documentation

## 2016-07-01 DIAGNOSIS — Y929 Unspecified place or not applicable: Secondary | ICD-10-CM | POA: Insufficient documentation

## 2016-07-01 DIAGNOSIS — S0990XA Unspecified injury of head, initial encounter: Secondary | ICD-10-CM | POA: Diagnosis not present

## 2016-07-01 DIAGNOSIS — Z7984 Long term (current) use of oral hypoglycemic drugs: Secondary | ICD-10-CM | POA: Insufficient documentation

## 2016-07-01 DIAGNOSIS — S060X0A Concussion without loss of consciousness, initial encounter: Secondary | ICD-10-CM | POA: Insufficient documentation

## 2016-07-01 DIAGNOSIS — E119 Type 2 diabetes mellitus without complications: Secondary | ICD-10-CM | POA: Insufficient documentation

## 2016-07-01 DIAGNOSIS — Z79899 Other long term (current) drug therapy: Secondary | ICD-10-CM | POA: Insufficient documentation

## 2016-07-01 DIAGNOSIS — Y999 Unspecified external cause status: Secondary | ICD-10-CM | POA: Insufficient documentation

## 2016-07-01 MED ORDER — ACETAMINOPHEN 325 MG PO TABS
650.0000 mg | ORAL_TABLET | Freq: Once | ORAL | Status: AC
  Administered 2016-07-01: 650 mg via ORAL
  Filled 2016-07-01: qty 2

## 2016-07-01 MED ORDER — ASPIRIN-ACETAMINOPHEN-CAFFEINE 250-250-65 MG PO TABS: 1.0000 | ORAL_TABLET | Freq: Four times a day (QID) | ORAL | 0 refills | Status: DC | PRN

## 2016-07-01 NOTE — ED Provider Notes (Signed)
[facility]-EMERGENCY DEPT Provider Note   CSN: [id number] Arrival date & time: 07/01/16  1332   By signing my name below, I, Teofilo Pod, attest that this documentation has been prepared under the direction and in the presence of Fayrene Helper, PA-C. Electronically Signed: Teofilo Pod, ED Scribe. 07/01/2016. 2:53 PM.   History   Chief Complaint Chief Complaint  Patient presents with  . Fall    The history is provided by the patient. No language interpreter was used.   HPI Comments:  Ashley Crane is a 56 y.o. female who presents to the Emergency Department s/p fall yesterday. Pt reports that she slipped backwards off of a step and hit her head on a cement wall. She did not lose consciousness. Pt was helped up by another person. Pt complains of associated neck pain, 1 episode of vomiting, headache and pain where she hit her head. She rates the pain at 7/10. Pt does not take any anticoagulates. Pt has taken Advil and Excedrin yesterday with no relief. Pain is currently rates as 7/10.     Past Medical History:  Diagnosis Date  . Diabetes mellitus without complication (HCC)   . Hyperlipidemia   . Hypertension     There are no active problems to display for this patient.   Past Surgical History:  Procedure Laterality Date  . BACK SURGERY  2007  . CHOLECYSTECTOMY      OB History    No data available       Home Medications    Prior to Admission medications   Medication Sig Start Date End Date Taking? Authorizing Provider  aspirin-acetaminophen-caffeine (EXCEDRIN MIGRAINE) [phone] MG per tablet Take 1 tablet by mouth every 6 (six) hours as needed for pain.    Historical Provider, MD  atorvastatin (LIPITOR) 40 MG tablet Take 40 mg by mouth daily.    Historical Provider, MD  canagliflozin (INVOKANA) 300 MG TABS tablet Take 300 mg by mouth daily before breakfast.    Historical Provider, MD  metFORMIN (GLUCOPHAGE) 500 MG tablet Take 500 mg by mouth 2 (two) times  daily with a meal.    Historical Provider, MD  PRESCRIPTION MEDICATION Take 1 tablet by mouth daily. Lisinopril HCTZ 10/ 25 mg    Historical Provider, MD  valsartan-hydrochlorothiazide (DIOVAN-HCT) 320-25 MG per tablet Take 1 tablet by mouth daily.    Historical Provider, MD    Family History Family History  Problem Relation Age of Onset  . Hyperlipidemia Other   . Hypertension Other   . Diabetes Other   . Heart disease Other   . Sleep apnea Other     Social History Social History  Substance Use Topics  . Smoking status: Never Smoker  . Smokeless tobacco: Never Used  . Alcohol use No     Allergies   Oxycodone hcl   Review of Systems Review of Systems  Gastrointestinal: Positive for vomiting.  Neurological: Positive for headaches. Negative for syncope.  All other systems reviewed and are negative.    Physical Exam Updated Vital Signs BP 142/68 (BP Location: Left Arm)   Pulse 63   Temp 98.7 F (37.1 C) (Oral)   Resp 18   SpO2 100%   Physical Exam  Constitutional: She is oriented to person, place, and time. She appears well-developed and well-nourished. No distress.  HENT:  Head: Normocephalic.  TTP to right parietal scalp without bruising or laceration. No hemotympanum and no Raccoon's eyes or Battle's sign. Full ROM.   Eyes: Conjunctivae  are normal.  Cardiovascular: Normal rate.   Pulmonary/Chest: Effort normal.  Abdominal: She exhibits no distension.  Neurological: She is alert and oriented to person, place, and time. No cranial nerve deficit.  Normal finger to nose. Able to move all 4 extremities. CN 3-12 grossly intact. Speech is goal oriented.    Skin: Skin is warm and dry.  Psychiatric: She has a normal mood and affect.  Nursing note and vitals reviewed.    ED Treatments / Results  DIAGNOSTIC STUDIES:  Oxygen Saturation is 100% on RA, normal by my interpretation.    COORDINATION OF CARE:  2:52 PM Will order CT scan.  Discussed treatment plan  with pt at bedside and pt agreed to plan.   Labs (all labs ordered are listed, but only abnormal results are displayed) Labs Reviewed - No data to display  EKG  EKG Interpretation None       Radiology Ct Head Wo Contrast  Result Date: 07/01/2016 CLINICAL DATA:  Fall last night.  Head injury. EXAM: CT HEAD WITHOUT CONTRAST TECHNIQUE: Contiguous axial images were obtained from the base of the skull through the vertex without intravenous contrast. COMPARISON:  02/24/2013 FINDINGS: Brain: No mass effect, midline shift, or acute hemorrhage. Minimal atrophy. Minimal chronic ischemic changes in the periventricular white matter. Vascular: No hyperdense vessel or unexpected calcification. Skull: Normal. Negative for fracture or focal lesion. Sinuses/Orbits: No acute finding. Other: None. IMPRESSION: No acute intracranial pathology. Electronically Signed   By: Jolaine ClickArthur  Hoss M.D.   On: 07/01/2016 16:13    Procedures Procedures (including critical care time)  Medications Ordered in ED Medications  acetaminophen (TYLENOL) tablet 650 mg (not administered)     Initial Impression / Assessment and Plan / ED Course  I have reviewed the triage vital signs and the nursing notes.  Pertinent labs & imaging results that were available during my care of the patient were reviewed by me and considered in my medical decision making (see chart for details).     BP 142/68 (BP Location: Left Arm)   Pulse 63   Temp 98.7 F (37.1 C) (Oral)   Resp 18   SpO2 100%    Final Clinical Impressions(s) / ED Diagnoses   Final diagnoses:  Minor head injury, initial encounter  Concussion without loss of consciousness, initial encounter    New Prescriptions Current Discharge Medication List     I personally performed the services described in this documentation, which was scribed in my presence. The recorded information has been reviewed and is accurate.     3:44 PM Pt here for evaluation of minor head  injury yesterday when she struck her scalp against cemented wall. She has mild concussive sxs.  She is NVI.  Ambulate without difficulty.  Pt did request for a head CT scan.  Although I have low suspicion for ICH or other significant head trauma, a head CT scan ordered per request.    I did encourage pt to rest, avoid stimulant, and also given sign/symptoms to return.     Fayrene HelperBowie Deborah Lazcano, PA-C 07/01/16 1618    Arby BarretteMarcy Pfeiffer, MD 07/15/16 [phone]

## 2016-07-01 NOTE — ED Triage Notes (Signed)
Pt states that she slipped anf fell yesterday hitting her head. Pt reports tenderness to rt side of head. Pt states that she has had 1 episode of vomiting. Pt states that she has headache since.

## 2016-07-01 NOTE — ED Notes (Signed)
Pt stable, ambulatory, states understanding of discharge instructions 

## 2016-07-08 DIAGNOSIS — G8929 Other chronic pain: Secondary | ICD-10-CM | POA: Diagnosis not present

## 2016-07-08 DIAGNOSIS — M25511 Pain in right shoulder: Secondary | ICD-10-CM | POA: Diagnosis not present

## 2016-07-15 DIAGNOSIS — M25511 Pain in right shoulder: Secondary | ICD-10-CM | POA: Diagnosis not present

## 2016-07-15 DIAGNOSIS — G8929 Other chronic pain: Secondary | ICD-10-CM | POA: Diagnosis not present

## 2016-07-15 DIAGNOSIS — M7541 Impingement syndrome of right shoulder: Secondary | ICD-10-CM | POA: Diagnosis not present

## 2016-07-15 DIAGNOSIS — S46011A Strain of muscle(s) and tendon(s) of the rotator cuff of right shoulder, initial encounter: Secondary | ICD-10-CM | POA: Diagnosis not present

## 2016-08-06 DIAGNOSIS — I10 Essential (primary) hypertension: Secondary | ICD-10-CM | POA: Diagnosis not present

## 2016-08-06 DIAGNOSIS — E669 Obesity, unspecified: Secondary | ICD-10-CM | POA: Diagnosis not present

## 2016-08-06 DIAGNOSIS — E1165 Type 2 diabetes mellitus with hyperglycemia: Secondary | ICD-10-CM | POA: Diagnosis not present

## 2016-08-06 DIAGNOSIS — M25511 Pain in right shoulder: Secondary | ICD-10-CM | POA: Diagnosis not present

## 2016-09-17 DIAGNOSIS — M25511 Pain in right shoulder: Secondary | ICD-10-CM | POA: Diagnosis not present

## 2016-09-17 DIAGNOSIS — I10 Essential (primary) hypertension: Secondary | ICD-10-CM | POA: Diagnosis not present

## 2016-09-17 DIAGNOSIS — E785 Hyperlipidemia, unspecified: Secondary | ICD-10-CM | POA: Diagnosis not present

## 2016-09-17 DIAGNOSIS — E1165 Type 2 diabetes mellitus with hyperglycemia: Secondary | ICD-10-CM | POA: Diagnosis not present

## 2016-09-22 DIAGNOSIS — I10 Essential (primary) hypertension: Secondary | ICD-10-CM | POA: Diagnosis not present

## 2016-09-22 DIAGNOSIS — E785 Hyperlipidemia, unspecified: Secondary | ICD-10-CM | POA: Diagnosis not present

## 2016-09-22 DIAGNOSIS — E1165 Type 2 diabetes mellitus with hyperglycemia: Secondary | ICD-10-CM | POA: Diagnosis not present

## 2016-10-29 DIAGNOSIS — E1165 Type 2 diabetes mellitus with hyperglycemia: Secondary | ICD-10-CM | POA: Diagnosis not present

## 2016-10-29 DIAGNOSIS — I10 Essential (primary) hypertension: Secondary | ICD-10-CM | POA: Diagnosis not present

## 2016-10-29 DIAGNOSIS — M5430 Sciatica, unspecified side: Secondary | ICD-10-CM | POA: Diagnosis not present

## 2016-11-04 DIAGNOSIS — M13859 Other specified arthritis, unspecified hip: Secondary | ICD-10-CM | POA: Diagnosis not present

## 2016-11-04 DIAGNOSIS — G4700 Insomnia, unspecified: Secondary | ICD-10-CM | POA: Diagnosis not present

## 2016-11-04 DIAGNOSIS — G894 Chronic pain syndrome: Secondary | ICD-10-CM | POA: Diagnosis not present

## 2016-11-04 DIAGNOSIS — E1165 Type 2 diabetes mellitus with hyperglycemia: Secondary | ICD-10-CM | POA: Diagnosis not present

## 2016-11-04 DIAGNOSIS — Z7982 Long term (current) use of aspirin: Secondary | ICD-10-CM | POA: Diagnosis not present

## 2016-11-04 DIAGNOSIS — E119 Type 2 diabetes mellitus without complications: Secondary | ICD-10-CM | POA: Diagnosis not present

## 2016-11-04 DIAGNOSIS — E669 Obesity, unspecified: Secondary | ICD-10-CM | POA: Diagnosis not present

## 2016-11-04 DIAGNOSIS — E7800 Pure hypercholesterolemia, unspecified: Secondary | ICD-10-CM | POA: Diagnosis not present

## 2016-11-04 DIAGNOSIS — I10 Essential (primary) hypertension: Secondary | ICD-10-CM | POA: Diagnosis not present

## 2016-11-04 DIAGNOSIS — R69 Illness, unspecified: Secondary | ICD-10-CM | POA: Diagnosis not present

## 2016-11-04 DIAGNOSIS — Z0000 Encounter for general adult medical examination without abnormal findings: Secondary | ICD-10-CM | POA: Diagnosis not present

## 2016-11-10 DIAGNOSIS — R1011 Right upper quadrant pain: Secondary | ICD-10-CM | POA: Diagnosis not present

## 2016-11-10 DIAGNOSIS — K5904 Chronic idiopathic constipation: Secondary | ICD-10-CM | POA: Diagnosis not present

## 2016-11-10 DIAGNOSIS — R140 Abdominal distension (gaseous): Secondary | ICD-10-CM | POA: Diagnosis not present

## 2016-11-17 ENCOUNTER — Other Ambulatory Visit: Payer: Self-pay | Admitting: Gastroenterology

## 2016-11-17 DIAGNOSIS — R1310 Dysphagia, unspecified: Secondary | ICD-10-CM

## 2016-11-17 NOTE — Progress Notes (Signed)
Infant Doane MD 

## 2016-12-01 ENCOUNTER — Ambulatory Visit
Admission: RE | Admit: 2016-12-01 | Discharge: 2016-12-01 | Disposition: A | Discharge status: Home / Self Care | Payer: Medicare HMO | Source: Ambulatory Visit | Attending: Gastroenterology | Admitting: Gastroenterology

## 2016-12-01 DIAGNOSIS — K219 Gastro-esophageal reflux disease without esophagitis: Secondary | ICD-10-CM | POA: Diagnosis not present

## 2016-12-01 DIAGNOSIS — R1310 Dysphagia, unspecified: Secondary | ICD-10-CM

## 2016-12-09 DIAGNOSIS — K219 Gastro-esophageal reflux disease without esophagitis: Secondary | ICD-10-CM | POA: Diagnosis not present

## 2016-12-09 DIAGNOSIS — R1310 Dysphagia, unspecified: Secondary | ICD-10-CM | POA: Diagnosis not present

## 2016-12-09 DIAGNOSIS — R933 Abnormal findings on diagnostic imaging of other parts of digestive tract: Secondary | ICD-10-CM | POA: Diagnosis not present

## 2016-12-24 DIAGNOSIS — K5904 Chronic idiopathic constipation: Secondary | ICD-10-CM | POA: Diagnosis not present

## 2016-12-24 DIAGNOSIS — E669 Obesity, unspecified: Secondary | ICD-10-CM | POA: Diagnosis not present

## 2016-12-24 DIAGNOSIS — R140 Abdominal distension (gaseous): Secondary | ICD-10-CM | POA: Diagnosis not present

## 2017-01-04 DIAGNOSIS — Z139 Encounter for screening, unspecified: Secondary | ICD-10-CM | POA: Diagnosis not present

## 2017-01-04 DIAGNOSIS — M5430 Sciatica, unspecified side: Secondary | ICD-10-CM | POA: Diagnosis not present

## 2017-01-04 DIAGNOSIS — I10 Essential (primary) hypertension: Secondary | ICD-10-CM | POA: Diagnosis not present

## 2017-01-04 DIAGNOSIS — E559 Vitamin D deficiency, unspecified: Secondary | ICD-10-CM | POA: Diagnosis not present

## 2017-01-04 DIAGNOSIS — E1165 Type 2 diabetes mellitus with hyperglycemia: Secondary | ICD-10-CM | POA: Diagnosis not present

## 2017-01-04 DIAGNOSIS — E669 Obesity, unspecified: Secondary | ICD-10-CM | POA: Diagnosis not present

## 2017-01-04 DIAGNOSIS — Z0001 Encounter for general adult medical examination with abnormal findings: Secondary | ICD-10-CM | POA: Diagnosis not present

## 2017-01-04 DIAGNOSIS — E785 Hyperlipidemia, unspecified: Secondary | ICD-10-CM | POA: Diagnosis not present

## 2017-02-24 DIAGNOSIS — Z23 Encounter for immunization: Secondary | ICD-10-CM | POA: Diagnosis not present

## 2017-02-24 DIAGNOSIS — J45909 Unspecified asthma, uncomplicated: Secondary | ICD-10-CM | POA: Diagnosis not present

## 2017-02-24 DIAGNOSIS — E785 Hyperlipidemia, unspecified: Secondary | ICD-10-CM | POA: Diagnosis not present

## 2017-02-24 DIAGNOSIS — R69 Illness, unspecified: Secondary | ICD-10-CM | POA: Diagnosis not present

## 2017-02-24 DIAGNOSIS — E1165 Type 2 diabetes mellitus with hyperglycemia: Secondary | ICD-10-CM | POA: Diagnosis not present

## 2017-02-24 DIAGNOSIS — E669 Obesity, unspecified: Secondary | ICD-10-CM | POA: Diagnosis not present

## 2017-02-24 DIAGNOSIS — K5900 Constipation, unspecified: Secondary | ICD-10-CM | POA: Diagnosis not present

## 2017-03-02 DIAGNOSIS — E119 Type 2 diabetes mellitus without complications: Secondary | ICD-10-CM | POA: Diagnosis not present

## 2017-03-02 DIAGNOSIS — H04123 Dry eye syndrome of bilateral lacrimal glands: Secondary | ICD-10-CM | POA: Diagnosis not present

## 2017-03-02 DIAGNOSIS — H2513 Age-related nuclear cataract, bilateral: Secondary | ICD-10-CM | POA: Diagnosis not present

## 2017-03-02 DIAGNOSIS — H40013 Open angle with borderline findings, low risk, bilateral: Secondary | ICD-10-CM | POA: Diagnosis not present

## 2017-04-06 DIAGNOSIS — M25511 Pain in right shoulder: Secondary | ICD-10-CM | POA: Diagnosis not present

## 2017-04-06 DIAGNOSIS — I10 Essential (primary) hypertension: Secondary | ICD-10-CM | POA: Diagnosis not present

## 2017-04-06 DIAGNOSIS — E785 Hyperlipidemia, unspecified: Secondary | ICD-10-CM | POA: Diagnosis not present

## 2017-04-06 DIAGNOSIS — E1165 Type 2 diabetes mellitus with hyperglycemia: Secondary | ICD-10-CM | POA: Diagnosis not present

## 2017-04-06 DIAGNOSIS — J069 Acute upper respiratory infection, unspecified: Secondary | ICD-10-CM | POA: Diagnosis not present

## 2017-04-28 DIAGNOSIS — Z1231 Encounter for screening mammogram for malignant neoplasm of breast: Secondary | ICD-10-CM | POA: Diagnosis not present

## 2017-06-21 DIAGNOSIS — E1165 Type 2 diabetes mellitus with hyperglycemia: Secondary | ICD-10-CM | POA: Diagnosis not present

## 2017-06-21 DIAGNOSIS — I10 Essential (primary) hypertension: Secondary | ICD-10-CM | POA: Diagnosis not present

## 2017-06-21 DIAGNOSIS — R69 Illness, unspecified: Secondary | ICD-10-CM | POA: Diagnosis not present

## 2017-06-21 DIAGNOSIS — E669 Obesity, unspecified: Secondary | ICD-10-CM | POA: Diagnosis not present

## 2017-08-03 DIAGNOSIS — I10 Essential (primary) hypertension: Secondary | ICD-10-CM | POA: Diagnosis not present

## 2017-08-03 DIAGNOSIS — E1165 Type 2 diabetes mellitus with hyperglycemia: Secondary | ICD-10-CM | POA: Diagnosis not present

## 2017-08-03 DIAGNOSIS — E785 Hyperlipidemia, unspecified: Secondary | ICD-10-CM | POA: Diagnosis not present

## 2017-08-03 DIAGNOSIS — E669 Obesity, unspecified: Secondary | ICD-10-CM | POA: Diagnosis not present

## 2017-08-03 DIAGNOSIS — M25562 Pain in left knee: Secondary | ICD-10-CM | POA: Diagnosis not present

## 2017-08-30 DIAGNOSIS — R69 Illness, unspecified: Secondary | ICD-10-CM | POA: Diagnosis not present

## 2017-10-05 DIAGNOSIS — I10 Essential (primary) hypertension: Secondary | ICD-10-CM | POA: Diagnosis not present

## 2017-10-05 DIAGNOSIS — E785 Hyperlipidemia, unspecified: Secondary | ICD-10-CM | POA: Diagnosis not present

## 2017-10-05 DIAGNOSIS — E1165 Type 2 diabetes mellitus with hyperglycemia: Secondary | ICD-10-CM | POA: Diagnosis not present

## 2017-11-30 DIAGNOSIS — Z794 Long term (current) use of insulin: Secondary | ICD-10-CM | POA: Diagnosis not present

## 2017-11-30 DIAGNOSIS — Z6835 Body mass index (BMI) 35.0-35.9, adult: Secondary | ICD-10-CM | POA: Diagnosis not present

## 2017-11-30 DIAGNOSIS — E119 Type 2 diabetes mellitus without complications: Secondary | ICD-10-CM | POA: Diagnosis not present

## 2017-11-30 DIAGNOSIS — K219 Gastro-esophageal reflux disease without esophagitis: Secondary | ICD-10-CM | POA: Diagnosis not present

## 2017-11-30 DIAGNOSIS — I10 Essential (primary) hypertension: Secondary | ICD-10-CM | POA: Diagnosis not present

## 2017-11-30 DIAGNOSIS — E669 Obesity, unspecified: Secondary | ICD-10-CM | POA: Diagnosis not present

## 2017-11-30 DIAGNOSIS — K5900 Constipation, unspecified: Secondary | ICD-10-CM | POA: Diagnosis not present

## 2017-11-30 DIAGNOSIS — G8929 Other chronic pain: Secondary | ICD-10-CM | POA: Diagnosis not present

## 2017-11-30 DIAGNOSIS — Z791 Long term (current) use of non-steroidal anti-inflammatories (NSAID): Secondary | ICD-10-CM | POA: Diagnosis not present

## 2017-11-30 DIAGNOSIS — E785 Hyperlipidemia, unspecified: Secondary | ICD-10-CM | POA: Diagnosis not present

## 2018-01-04 DIAGNOSIS — G629 Polyneuropathy, unspecified: Secondary | ICD-10-CM | POA: Diagnosis not present

## 2018-01-04 DIAGNOSIS — J309 Allergic rhinitis, unspecified: Secondary | ICD-10-CM | POA: Diagnosis not present

## 2018-01-04 DIAGNOSIS — E1165 Type 2 diabetes mellitus with hyperglycemia: Secondary | ICD-10-CM | POA: Diagnosis not present

## 2018-01-04 DIAGNOSIS — E669 Obesity, unspecified: Secondary | ICD-10-CM | POA: Diagnosis not present

## 2018-01-04 DIAGNOSIS — E785 Hyperlipidemia, unspecified: Secondary | ICD-10-CM | POA: Diagnosis not present

## 2018-01-04 DIAGNOSIS — R1013 Epigastric pain: Secondary | ICD-10-CM | POA: Diagnosis not present

## 2018-01-04 DIAGNOSIS — E559 Vitamin D deficiency, unspecified: Secondary | ICD-10-CM | POA: Diagnosis not present

## 2018-01-04 DIAGNOSIS — Z0000 Encounter for general adult medical examination without abnormal findings: Secondary | ICD-10-CM | POA: Diagnosis not present

## 2018-01-04 DIAGNOSIS — I10 Essential (primary) hypertension: Secondary | ICD-10-CM | POA: Diagnosis not present

## 2018-02-25 ENCOUNTER — Ambulatory Visit (INDEPENDENT_AMBULATORY_CARE_PROVIDER_SITE_OTHER): Payer: Medicare HMO

## 2018-02-25 ENCOUNTER — Encounter: Payer: Self-pay | Admitting: Podiatry

## 2018-02-25 ENCOUNTER — Ambulatory Visit: Payer: Medicare HMO | Admitting: Podiatry

## 2018-02-25 DIAGNOSIS — M2042 Other hammer toe(s) (acquired), left foot: Secondary | ICD-10-CM

## 2018-02-25 DIAGNOSIS — E559 Vitamin D deficiency, unspecified: Secondary | ICD-10-CM | POA: Insufficient documentation

## 2018-02-25 DIAGNOSIS — M216X9 Other acquired deformities of unspecified foot: Secondary | ICD-10-CM | POA: Diagnosis not present

## 2018-02-25 DIAGNOSIS — E1149 Type 2 diabetes mellitus with other diabetic neurological complication: Secondary | ICD-10-CM | POA: Diagnosis not present

## 2018-02-25 DIAGNOSIS — M2041 Other hammer toe(s) (acquired), right foot: Secondary | ICD-10-CM

## 2018-02-25 DIAGNOSIS — Q828 Other specified congenital malformations of skin: Secondary | ICD-10-CM

## 2018-02-25 DIAGNOSIS — I10 Essential (primary) hypertension: Secondary | ICD-10-CM | POA: Insufficient documentation

## 2018-02-25 DIAGNOSIS — G4700 Insomnia, unspecified: Secondary | ICD-10-CM | POA: Insufficient documentation

## 2018-02-25 DIAGNOSIS — M25519 Pain in unspecified shoulder: Secondary | ICD-10-CM | POA: Insufficient documentation

## 2018-02-25 DIAGNOSIS — E669 Obesity, unspecified: Secondary | ICD-10-CM | POA: Insufficient documentation

## 2018-02-25 DIAGNOSIS — M5430 Sciatica, unspecified side: Secondary | ICD-10-CM | POA: Insufficient documentation

## 2018-02-25 DIAGNOSIS — M1710 Unilateral primary osteoarthritis, unspecified knee: Secondary | ICD-10-CM | POA: Insufficient documentation

## 2018-02-25 DIAGNOSIS — M179 Osteoarthritis of knee, unspecified: Secondary | ICD-10-CM | POA: Insufficient documentation

## 2018-02-25 DIAGNOSIS — E785 Hyperlipidemia, unspecified: Secondary | ICD-10-CM | POA: Insufficient documentation

## 2018-02-25 NOTE — Progress Notes (Signed)
Subjective:    Patient ID: Ashley Crane, female    DOB: 07-27-1960, 57 y.o.   MRN: [id number]  HPI 57 year old female presents the office today for concerns of burning, sharp pain to both of her feet which is worse at nighttime.  She thinks that she may have neuropathy but she has not been treated for this.  She also gets painful calluses to both of her feet at times.  She has a history of tailor's bunionectomy performed and since then she states that she developed a callus and she points to submetatarsal 5.  She denies any open sores any history of ulceration.  She denies any swelling or drainage from the callus sites.  She is diabetic and she states her last blood sugar that she checked was 176 her last A1c was around 11.   Review of Systems  All other systems reviewed and are negative.  Past Medical History:  Diagnosis Date  . Diabetes mellitus without complication (HCC)   . Hyperlipidemia   . Hypertension     Past Surgical History:  Procedure Laterality Date  . BACK SURGERY  2007  . CHOLECYSTECTOMY       Current Outpatient Medications:  .  aspirin 81 MG chewable tablet, aspirin 81 mg chewable tablet  1 tablet orally once a day, Disp: , Rfl:  .  NON FORMULARY, [facility]  Peripheral Neuropathy Cream- Bupivacaine 1%, Doxepin 3%, Gabapentin 6%, Pentoxifylline 3%, Topiramate 1% Apply 1-2 grams to affected area 3-4 times daily Qty. 120 gm 3 refills, Disp: , Rfl:  .  amLODipine (NORVASC) 10 MG tablet, amlodipine 10 mg tablet, Disp: , Rfl:  .  atorvastatin (LIPITOR) 40 MG tablet, Take 40 mg by mouth daily., Disp: , Rfl:  .  canagliflozin (INVOKANA) 300 MG TABS tablet, Take 300 mg by mouth daily before breakfast., Disp: , Rfl:  .  metFORMIN (GLUCOPHAGE) 500 MG tablet, Take 500 mg by mouth 2 (two) times daily with a meal., Disp: , Rfl:  .  PRESCRIPTION MEDICATION, Take 1 tablet by mouth daily. Lisinopril HCTZ 10/ 25 mg, Disp: , Rfl:  .  valsartan-hydrochlorothiazide  (DIOVAN-HCT) 320-25 MG per tablet, Take 1 tablet by mouth daily., Disp: , Rfl:   Allergies  Allergen Reactions  . Oxycodone Hcl     Unknown sick feeling         Objective:   Physical Exam General: AAO x3, NAD  Dermatological: Thick hyperkeratotic lesions submetatarsal 5 bilaterally as well as fifth metatarsal base.  Upon debridement there is no underlying ulceration, drainage or any clinical signs of infection noted today.  No ulcerations are present or open sores.  Scars from prior surgery well-healed.  Vascular: Dorsalis Pedis artery and Posterior Tibial artery pedal pulses are 2/4 bilateral with immedate capillary fill time. There is no pain with calf compression, swelling, warmth, erythema.   Neruologic: Grossly intact via light touch bilateral. Protective threshold with Semmes Wienstein monofilament intact to all pedal sites bilateral.   Musculoskeletal: Prominent metatarsal heads plantarly with atrophy of the fat pad.  Muscular strength 5/5 in all groups tested bilateral.  Hammertoes are present.  Gait: Unassisted, Nonantalgic.     Assessment & Plan:  57 year old female with diabetes and likely neuropathy with some dramatic hyperkeratotic lesions -Treatment options discussed including all alternatives, risks, and complications -Etiology of symptoms were discussed -X-rays were obtained and reviewed with the patient.  Evidence of previous tailor's bunionectomy present.  There is no evidence of acute fracture or stress fracture. -  Sharply debrided the hyperkeratotic lesions x4 today with any complications or bleeding.  Discussed moisturizer to the area daily.  Also can check insurance coverage is giving her some accommodative orthotics to help offload the hyperkeratotic lesions. -Also discussed likely neuropathy symptoms.  Discussed treatment options and she was to hold off any oral medications.  Prescribed compound cream to include gabapentin.  Vivi Barrack DPM

## 2018-03-01 DIAGNOSIS — R749 Abnormal serum enzyme level, unspecified: Secondary | ICD-10-CM | POA: Diagnosis not present

## 2018-03-01 DIAGNOSIS — E785 Hyperlipidemia, unspecified: Secondary | ICD-10-CM | POA: Diagnosis not present

## 2018-03-01 DIAGNOSIS — E1165 Type 2 diabetes mellitus with hyperglycemia: Secondary | ICD-10-CM | POA: Diagnosis not present

## 2018-03-03 ENCOUNTER — Telehealth: Payer: Self-pay | Admitting: Podiatry

## 2018-03-03 NOTE — Telephone Encounter (Signed)
I'm calling to find out about the specialty cream that's supposed to be made that Dr. Ardelle Anton prescribed. Please call me back at [phone].

## 2018-03-04 NOTE — Telephone Encounter (Signed)
Left message informing pt of [facility] [phone] for information concerning coverage and delivery.

## 2018-03-09 ENCOUNTER — Ambulatory Visit: Payer: Medicare HMO | Admitting: Orthotics

## 2018-03-09 DIAGNOSIS — M2042 Other hammer toe(s) (acquired), left foot: Secondary | ICD-10-CM

## 2018-03-09 DIAGNOSIS — M2041 Other hammer toe(s) (acquired), right foot: Secondary | ICD-10-CM

## 2018-03-09 DIAGNOSIS — E1149 Type 2 diabetes mellitus with other diabetic neurological complication: Secondary | ICD-10-CM

## 2018-03-09 DIAGNOSIS — M216X9 Other acquired deformities of unspecified foot: Secondary | ICD-10-CM

## 2018-03-09 NOTE — Progress Notes (Signed)
After discussing w/ patient that financial responsibility that CM[employer] would entail, she decided to go with DBS and inserts which would be covered by [employer]...  She has documented condititions that would qualify her.

## 2018-04-13 ENCOUNTER — Telehealth: Payer: Self-pay | Admitting: Podiatry

## 2018-04-13 NOTE — Telephone Encounter (Signed)
Pt called and left message checking on status of shoes.  I returned call and told pt we are waiting on Dr Renae GlossShelton to sign off on paperwork. She stated she was calling that office.

## 2018-04-27 DIAGNOSIS — H2513 Age-related nuclear cataract, bilateral: Secondary | ICD-10-CM | POA: Diagnosis not present

## 2018-04-27 DIAGNOSIS — H40013 Open angle with borderline findings, low risk, bilateral: Secondary | ICD-10-CM | POA: Diagnosis not present

## 2018-04-27 DIAGNOSIS — E119 Type 2 diabetes mellitus without complications: Secondary | ICD-10-CM | POA: Diagnosis not present

## 2018-04-27 DIAGNOSIS — H35373 Puckering of macula, bilateral: Secondary | ICD-10-CM | POA: Diagnosis not present

## 2018-05-10 ENCOUNTER — Ambulatory Visit (INDEPENDENT_AMBULATORY_CARE_PROVIDER_SITE_OTHER): Payer: Medicare HMO | Admitting: Orthotics

## 2018-05-10 DIAGNOSIS — M216X9 Other acquired deformities of unspecified foot: Secondary | ICD-10-CM

## 2018-05-10 DIAGNOSIS — M2041 Other hammer toe(s) (acquired), right foot: Secondary | ICD-10-CM

## 2018-05-10 DIAGNOSIS — M2042 Other hammer toe(s) (acquired), left foot: Secondary | ICD-10-CM

## 2018-05-10 DIAGNOSIS — Q828 Other specified congenital malformations of skin: Secondary | ICD-10-CM

## 2018-05-10 DIAGNOSIS — E1149 Type 2 diabetes mellitus with other diabetic neurological complication: Secondary | ICD-10-CM | POA: Diagnosis not present

## 2018-05-10 NOTE — Progress Notes (Signed)

## 2018-05-24 ENCOUNTER — Ambulatory Visit: Payer: Medicare HMO | Admitting: Podiatry

## 2018-05-24 DIAGNOSIS — J309 Allergic rhinitis, unspecified: Secondary | ICD-10-CM | POA: Insufficient documentation

## 2018-05-24 DIAGNOSIS — Q828 Other specified congenital malformations of skin: Secondary | ICD-10-CM | POA: Diagnosis not present

## 2018-05-24 DIAGNOSIS — E1149 Type 2 diabetes mellitus with other diabetic neurological complication: Secondary | ICD-10-CM | POA: Diagnosis not present

## 2018-05-30 NOTE — Progress Notes (Signed)
Subjective: 58 year old female presents the office today for follow-up evaluation of calluses to both of her feet.  She states that she trims her nails herself.  She states that she is walking much better with diabetic shoes.  She having less pain to her feet as well.  She did get some itching sensation to her feet which is been on there for 6 months she still has some pain to the callus on the outside aspect of her feet but she denies any open sores or redness or drainage or any swelling. Denies any systemic complaints such as fevers, chills, nausea, vomiting. No acute changes since last appointment, and no other complaints at this time.   Objective: AAO x3, NAD DP/PT pulses palpable bilaterally, CRT less than 3 seconds Hyperkeratotic lesions along the fifth metatarsal bases bilaterally without any underlying ulceration drainage or any signs of infection.  Sensation grossly appears to be intact with Semmes-Weinstein monofilament.  Subjectively she is getting some itching sensation to her feet.  I think some more from neuropathy.  There is no skin breakdown, rash, erythema or any evidence of tinea pedis. No open lesions or pre-ulcerative lesions.  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  -Sharp debrided the hyperkeratotic lesion stable any complications or bleeding.  There she is appearing well.  Continue monitoring skin breakdown or irritation.  Discussed moisturizer to the callus daily. -Patient encouraged to call the office with any questions, concerns, change in symptoms.   Vivi Barrack DPM

## 2018-06-07 DIAGNOSIS — Z1231 Encounter for screening mammogram for malignant neoplasm of breast: Secondary | ICD-10-CM | POA: Diagnosis not present

## 2018-07-12 DIAGNOSIS — I10 Essential (primary) hypertension: Secondary | ICD-10-CM | POA: Diagnosis not present

## 2018-07-12 DIAGNOSIS — E669 Obesity, unspecified: Secondary | ICD-10-CM | POA: Diagnosis not present

## 2018-07-12 DIAGNOSIS — M5430 Sciatica, unspecified side: Secondary | ICD-10-CM | POA: Diagnosis not present

## 2018-07-12 DIAGNOSIS — E785 Hyperlipidemia, unspecified: Secondary | ICD-10-CM | POA: Diagnosis not present

## 2018-07-12 DIAGNOSIS — E1165 Type 2 diabetes mellitus with hyperglycemia: Secondary | ICD-10-CM | POA: Diagnosis not present

## 2018-07-26 ENCOUNTER — Ambulatory Visit: Payer: Medicare HMO | Admitting: Podiatry

## 2018-07-26 ENCOUNTER — Other Ambulatory Visit: Payer: Self-pay

## 2018-07-26 DIAGNOSIS — E1149 Type 2 diabetes mellitus with other diabetic neurological complication: Secondary | ICD-10-CM | POA: Diagnosis not present

## 2018-07-26 DIAGNOSIS — M79674 Pain in right toe(s): Secondary | ICD-10-CM

## 2018-07-26 DIAGNOSIS — Q828 Other specified congenital malformations of skin: Secondary | ICD-10-CM

## 2018-07-26 DIAGNOSIS — B351 Tinea unguium: Secondary | ICD-10-CM | POA: Diagnosis not present

## 2018-07-26 DIAGNOSIS — M79675 Pain in left toe(s): Secondary | ICD-10-CM

## 2018-07-26 NOTE — Patient Instructions (Signed)
Diabetes Mellitus and Foot Care Foot care is an important part of your health, especially when you have diabetes. Diabetes may cause you to have problems because of poor blood flow (circulation) to your feet and legs, which can cause your skin to:  Become thinner and drier.  Break more easily.  Heal more slowly.  Peel and crack. You may also have nerve damage (neuropathy) in your legs and feet, causing decreased feeling in them. This means that you may not notice minor injuries to your feet that could lead to more serious problems. Noticing and addressing any potential problems early is the best way to prevent future foot problems. How to care for your feet Foot hygiene  Wash your feet daily with warm water and mild soap. Do not use hot water. Then, pat your feet and the areas between your toes until they are completely dry. Do not soak your feet as this can dry your skin.  Trim your toenails straight across. Do not dig under them or around the cuticle. File the edges of your nails with an emery board or nail file.  Apply a moisturizing lotion or petroleum jelly to the skin on your feet and to dry, brittle toenails. Use lotion that does not contain alcohol and is unscented. Do not apply lotion between your toes. Shoes and socks  Wear clean socks or stockings every day. Make sure they are not too tight. Do not wear knee-high stockings since they may decrease blood flow to your legs.  Wear shoes that fit properly and have enough cushioning. Always look in your shoes before you put them on to be sure there are no objects inside.  To break in new shoes, wear them for just a few hours a day. This prevents injuries on your feet. Wounds, scrapes, corns, and calluses  Check your feet daily for blisters, cuts, bruises, sores, and redness. If you cannot see the bottom of your feet, use a mirror or ask someone for help.  Do not cut corns or calluses or try to remove them with medicine.  If you  find a minor scrape, cut, or break in the skin on your feet, keep it and the skin around it clean and dry. You may clean these areas with mild soap and water. Do not clean the area with peroxide, alcohol, or iodine.  If you have a wound, scrape, corn, or callus on your foot, look at it several times a day to make sure it is healing and not infected. Check for: ? Redness, swelling, or pain. ? Fluid or blood. ? Warmth. ? Pus or a bad smell. General instructions  Do not cross your legs. This may decrease blood flow to your feet.  Do not use heating pads or hot water bottles on your feet. They may burn your skin. If you have lost feeling in your feet or legs, you may not know this is happening until it is too late.  Protect your feet from hot and cold by wearing shoes, such as at the beach or on hot pavement.  Schedule a complete foot exam at least once a year (annually) or more often if you have foot problems. If you have foot problems, report any cuts, sores, or bruises to your health care provider immediately. Contact a health care provider if:  You have a medical condition that increases your risk of infection and you have any cuts, sores, or bruises on your feet.  You have an injury that is not   healing.  You have redness on your legs or feet.  You feel burning or tingling in your legs or feet.  You have pain or cramps in your legs and feet.  Your legs or feet are numb.  Your feet always feel cold.  You have pain around a toenail. Get help right away if:  You have a wound, scrape, corn, or callus on your foot and: ? You have pain, swelling, or redness that gets worse. ? You have fluid or blood coming from the wound, scrape, corn, or callus. ? Your wound, scrape, corn, or callus feels warm to the touch. ? You have pus or a bad smell coming from the wound, scrape, corn, or callus. ? You have a fever. ? You have a red line going up your leg. Summary  Check your feet every day  for cuts, sores, red spots, swelling, and blisters.  Moisturize feet and legs daily.  Wear shoes that fit properly and have enough cushioning.  If you have foot problems, report any cuts, sores, or bruises to your health care provider immediately.  Schedule a complete foot exam at least once a year (annually) or more often if you have foot problems. This information is not intended to replace advice given to you by your health care provider. Make sure you discuss any questions you have with your health care provider. Document Released: 04/24/2000 Document Revised: 06/09/2017 Document Reviewed: 05/29/2016 Elsevier Interactive Patient Education  2019 [employer]  Corns and Calluses Corns are small areas of thickened skin that occur on the top, sides, or tip of a toe. They contain a cone-shaped core with a point that can press on a nerve below. This causes pain.  Calluses are areas of thickened skin that can occur anywhere on the body, including the hands, fingers, palms, soles of the feet, and heels. Calluses are usually larger than corns. What are the causes? Corns and calluses are caused by rubbing (friction) or pressure, such as from shoes that are too tight or do not fit properly. What increases the risk? Corns are more likely to develop in people who have misshapen toes (toe deformities), such as hammer toes. Calluses can occur with friction to any area of the skin. They are more likely to develop in people who:  Work with their hands.  Wear shoes that fit poorly, are too tight, or are high-heeled.  Have toe deformities. What are the signs or symptoms? Symptoms of a corn or callus include:  A hard growth on the skin.  Pain or tenderness under the skin.  Redness and swelling.  Increased discomfort while wearing tight-fitting shoes, if your feet are affected. If a corn or callus becomes infected, symptoms may include:  Redness and swelling that gets  worse.  Pain.  Fluid, blood, or pus draining from the corn or callus. How is this diagnosed? Corns and calluses may be diagnosed based on your symptoms, your medical history, and a physical exam. How is this treated? Treatment for corns and calluses may include:  Removing the cause of the friction or pressure. This may involve: ? Changing your shoes. ? Wearing shoe inserts (orthotics) or other protective layers in your shoes, such as a corn pad. ? Wearing gloves.  Applying medicine to the skin (topical medicine) to help soften skin in the hardened, thickened areas.  Removing layers of dead skin with a file to reduce the size of the corn or callus.  Removing the corn or callus with a scalpel   or laser.  Taking antibiotic medicines, if your corn or callus is infected.  Having surgery, if a toe deformity is the cause. Follow these instructions at home:   Take over-the-counter and prescription medicines only as told by your health care provider.  If you were prescribed an antibiotic, take it as told by your health care provider. Do not stop taking it even if your condition starts to improve.  Wear shoes that fit well. Avoid wearing high-heeled shoes and shoes that are too tight or too loose.  Wear any padding, protective layers, gloves, or orthotics as told by your health care provider.  Soak your hands or feet and then use a file or pumice stone to soften your corn or callus. Do this as told by your health care provider.  Check your corn or callus every day for symptoms of infection. Contact a health care provider if you:  Notice that your symptoms do not improve with treatment.  Have redness or swelling that gets worse.  Notice that your corn or callus becomes painful.  Have fluid, blood, or pus coming from your corn or callus.  Have new symptoms. Summary  Corns are small areas of thickened skin that occur on the top, sides, or tip of a toe.  Calluses are areas of  thickened skin that can occur anywhere on the body, including the hands, fingers, palms, and soles of the feet. Calluses are usually larger than corns.  Corns and calluses are caused by rubbing (friction) or pressure, such as from shoes that are too tight or do not fit properly.  Treatment may include wearing any padding, protective layers, gloves, or orthotics as told by your health care provider. This information is not intended to replace advice given to you by your health care provider. Make sure you discuss any questions you have with your health care provider. Document Released: 02/01/2004 Document Revised: 03/10/2017 Document Reviewed: 03/10/2017 Elsevier Interactive Patient Education  2019 [employer] Inc.  

## 2018-07-28 DIAGNOSIS — E1165 Type 2 diabetes mellitus with hyperglycemia: Secondary | ICD-10-CM | POA: Diagnosis not present

## 2018-07-28 DIAGNOSIS — I10 Essential (primary) hypertension: Secondary | ICD-10-CM | POA: Diagnosis not present

## 2018-07-28 DIAGNOSIS — E785 Hyperlipidemia, unspecified: Secondary | ICD-10-CM | POA: Diagnosis not present

## 2018-08-03 ENCOUNTER — Encounter: Payer: Self-pay | Admitting: Podiatry

## 2018-08-03 NOTE — Progress Notes (Signed)
Subjective: Ashley Crane presents with diabetes, diabetic neuropathy and cc of painful, discolored, thick toenails and painful callus/corn which interfere with activities of daily living. Pain is aggravated when wearing enclosed shoe gear. Pain is relieved with periodic professional debridement.  Willey Blade, MD is her PCP. Last visit was 03/01/2018.  She does relate subjective symptoms of neuropathy. She uses compounded neuropathy cream.    Current Outpatient Medications:  .  amLODipine (NORVASC) 10 MG tablet, amlodipine 10 mg tablet, Disp: , Rfl:  .  aspirin 81 MG chewable tablet, aspirin 81 mg chewable tablet  1 tablet orally once a day, Disp: , Rfl:  .  atorvastatin (LIPITOR) 40 MG tablet, Take 40 mg by mouth daily., Disp: , Rfl:  .  canagliflozin (INVOKANA) 300 MG TABS tablet, Take 300 mg by mouth daily before breakfast., Disp: , Rfl:  .  Continuous Blood Gluc Sensor (FREESTYLE LIBRE 14 DAY SENSOR) MISC, USE AS DIRECTED TO CHECK BLOOD SUGAR, Disp: , Rfl: 5 .  fluconazole (DIFLUCAN) 100 MG tablet, Take 100 mg by mouth daily., Disp: , Rfl:  .  Insulin Glargine-Lixisenatide (SOLIQUA) 100-33 UNT-MCG/ML SOPN, Soliqua 100/33  100 unit-33 mcg/mL subcutaneous insulin pen  Inject 50 units by subcutaneous route in the evening., Disp: , Rfl:  .  meloxicam (MOBIC) 15 MG tablet, Take 15 mg by mouth daily., Disp: , Rfl:  .  metFORMIN (GLUCOPHAGE) 1000 MG tablet, Take 1,000 mg by mouth 2 (two) times daily., Disp: , Rfl:  .  metFORMIN (GLUCOPHAGE) 500 MG tablet, Take 500 mg by mouth 2 (two) times daily with a meal., Disp: , Rfl:  .  NON FORMULARY, [facility]  Peripheral Neuropathy Cream- Bupivacaine 1%, Doxepin 3%, Gabapentin 6%, Pentoxifylline 3%, Topiramate 1% Apply 1-2 grams to affected area 3-4 times daily Qty. 120 gm 3 refills, Disp: , Rfl:  .  penicillin v potassium (VEETID) 500 MG tablet, TAKE 1 TABLET BY MOUTH 4 TIMES DAILY UNTIL GONE, Disp: , Rfl:  .  PRESCRIPTION MEDICATION, Take  1 tablet by mouth daily. Lisinopril HCTZ 10/ 25 mg, Disp: , Rfl:  .  SOLIQUA 100-33 UNT-MCG/ML SOPN, , Disp: , Rfl:  .  traMADol (ULTRAM) 50 MG tablet, , Disp: , Rfl:  .  valsartan-hydrochlorothiazide (DIOVAN-HCT) 320-25 MG per tablet, Take 1 tablet by mouth daily., Disp: , Rfl:   Allergies  Allergen Reactions  . Oxycodone Hcl     Unknown sick feeling    Vascular Examination: Capillary refill time less than 3 seconds  x 10 digits.  Dorsalis pedis pulses palpable b/l  Posterior tibial pulses palpable b/l  Digital hair absent x 10 digits.  Skin temperature gradient WNL b/l.  Dermatological Examination: Skin with normal turgor, texture and tone b/l.  Toenails 1-5 b/l discolored, thick, dystrophic with subungual debris and pain with palpation to nailbeds due to thickness of nails.  Incurvated nailplate b/l great toes with tenderness to palpation. No erythema, no edema, no drainage noted.   Hyperkeratotic lesion 5th metatarsal base b/l. No erythema, no edema, no drainage, no flocculence noted.   Musculoskeletal: Muscle strength 5/5 to all LE muscle groups  Neurological: Sensation intact with 10 gram monofilament.  Vibratory sensation diminished.  Assessment: 1. Painful onychomycosis toenails 1-5 b/l 2. Callus 5th met base b/l 3. NIDDM with Diabetic neuropathy  Plan: 1. Continue diabetic foot care principles. Literature dispensed on today. 2. Toenails 1-5 b/l were debrided in length and girth without iatrogenic bleeding. Offending nail borders debrided and curretaged b/l great toes. Senath  cleansed with alcohol. Antibiotic ointment applied. No further treatment required by patient. 3. Calluses pared 5th met base utilizing sterile scalpel blade without incident.  4. Patient to continue soft, supportive shoe gear 5. Patient to report any pedal injuries to medical professional  6. Follow up 3 months.  7. Patient/POA to call should there be a concern in the interim.

## 2018-11-01 ENCOUNTER — Encounter: Payer: Self-pay | Admitting: Podiatry

## 2018-11-01 ENCOUNTER — Ambulatory Visit: Payer: Medicare HMO | Admitting: Podiatry

## 2018-11-01 ENCOUNTER — Other Ambulatory Visit: Payer: Self-pay

## 2018-11-01 VITALS — Temp 97.6°F

## 2018-11-01 DIAGNOSIS — E1142 Type 2 diabetes mellitus with diabetic polyneuropathy: Secondary | ICD-10-CM | POA: Diagnosis not present

## 2018-11-01 DIAGNOSIS — M79675 Pain in left toe(s): Secondary | ICD-10-CM

## 2018-11-01 DIAGNOSIS — M2010 Hallux valgus (acquired), unspecified foot: Secondary | ICD-10-CM

## 2018-11-01 DIAGNOSIS — M79674 Pain in right toe(s): Secondary | ICD-10-CM

## 2018-11-01 DIAGNOSIS — L84 Corns and callosities: Secondary | ICD-10-CM

## 2018-11-01 DIAGNOSIS — B351 Tinea unguium: Secondary | ICD-10-CM

## 2018-11-01 DIAGNOSIS — M21619 Bunion of unspecified foot: Secondary | ICD-10-CM

## 2018-11-01 NOTE — Patient Instructions (Signed)

## 2018-11-10 ENCOUNTER — Other Ambulatory Visit: Payer: Self-pay

## 2018-11-10 ENCOUNTER — Ambulatory Visit: Payer: Medicare HMO | Admitting: Orthotics

## 2018-11-10 DIAGNOSIS — Q828 Other specified congenital malformations of skin: Secondary | ICD-10-CM

## 2018-11-10 DIAGNOSIS — E1142 Type 2 diabetes mellitus with diabetic polyneuropathy: Secondary | ICD-10-CM

## 2018-11-10 DIAGNOSIS — M2041 Other hammer toe(s) (acquired), right foot: Secondary | ICD-10-CM

## 2018-11-10 DIAGNOSIS — L84 Corns and callosities: Secondary | ICD-10-CM

## 2018-11-10 NOTE — Progress Notes (Signed)
Subjective: Ashley Crane presents with diabetes, diabetic neuropathy and cc of painful, discolored, thick toenails and painful calluses which interfere with activities of daily living. Pain is aggravated when wearing enclosed shoe gear. Pain is relieved with periodic professional debridement.  Patient states today both big toes feel ingrown and are tender when she wears her enclosed shoe gear.  She denies any redness drainage or swelling.  Andi DevonShelton, Kimberly, MD is her PCP. Last visit was July 26, 2018 per patient recall.   Current Outpatient Medications:  .  amLODipine (NORVASC) 10 MG tablet, amlodipine 10 mg tablet, Disp: , Rfl:  .  aspirin 81 MG chewable tablet, aspirin 81 mg chewable tablet  1 tablet orally once a day, Disp: , Rfl:  .  atorvastatin (LIPITOR) 40 MG tablet, Take 40 mg by mouth daily., Disp: , Rfl:  .  canagliflozin (INVOKANA) 300 MG TABS tablet, Take 300 mg by mouth daily before breakfast., Disp: , Rfl:  .  Continuous Blood Gluc Sensor (FREESTYLE LIBRE 14 DAY SENSOR) MISC, USE AS DIRECTED TO CHECK BLOOD SUGAR, Disp: , Rfl: 5 .  fluconazole (DIFLUCAN) 100 MG tablet, Take 100 mg by mouth daily., Disp: , Rfl:  .  Insulin Glargine-Lixisenatide (SOLIQUA) 100-33 UNT-MCG/ML SOPN, Soliqua 100/33  100 unit-33 mcg/mL subcutaneous insulin pen  Inject 50 units by subcutaneous route in the evening., Disp: , Rfl:  .  ketorolac (TORADOL) 60 MG/2ML SOLN injection, ketorolac 60 mg/2 mL intramuscular solution  Inject 60 mg IM x 1, Disp: , Rfl:  .  meloxicam (MOBIC) 15 MG tablet, Take 15 mg by mouth daily., Disp: , Rfl:  .  metFORMIN (GLUCOPHAGE) 1000 MG tablet, Take 1,000 mg by mouth 2 (two) times daily., Disp: , Rfl:  .  metFORMIN (GLUCOPHAGE) 500 MG tablet, Take 500 mg by mouth 2 (two) times daily with a meal., Disp: , Rfl:  .  NON FORMULARY, [facility]  Peripheral Neuropathy Cream- Bupivacaine 1%, Doxepin 3%, Gabapentin 6%, Pentoxifylline 3%, Topiramate 1% Apply 1-2 grams to  affected area 3-4 times daily Qty. 120 gm 3 refills, Disp: , Rfl:  .  penicillin v potassium (VEETID) 500 MG tablet, TAKE 1 TABLET BY MOUTH 4 TIMES DAILY UNTIL GONE, Disp: , Rfl:  .  PRESCRIPTION MEDICATION, Take 1 tablet by mouth daily. Lisinopril HCTZ 10/ 25 mg, Disp: , Rfl:  .  SOLIQUA 100-33 UNT-MCG/ML SOPN, , Disp: , Rfl:  .  traMADol (ULTRAM) 50 MG tablet, , Disp: , Rfl:  .  valsartan-hydrochlorothiazide (DIOVAN-HCT) 320-25 MG per tablet, Take 1 tablet by mouth daily., Disp: , Rfl:   Allergies  Allergen Reactions  . Oxycodone Hcl     Unknown sick feeling    Objective: Vitals:   11/01/18 1309  Temp: 97.6 F (36.4 C)    Vascular Examination: Capillary refill time less than 3 seconds x 10 digits.  Dorsalis pedis and posterior tibial pulses are palpable bilaterally.  Digital hair is absent x10 digits.  Skin temperature gradient within normal limits bilaterally.  Dermatological Examination: Skin with normal turgor, texture and tone b/l. Toenails 1-5 b/l discolored, thick, dystrophic with subungual debris and pain with palpation to nailbeds due to thickness of nails.Incurvated nailplate b/l great toes with tenderness to palpation. No erythema, no edema, no drainage noted.  Hyperkeratotic lesion(s) submetatarsal head 1 left foot, fifth metatarsal base bilaterally with tenderness to palpation. No erythema, no edema, no drainage, no flocculence noted.   Musculoskeletal: Muscle strength 5/5 to all LE muscle groups. Hallux abductovalgus with bunion  deformity bilaterally.No pain, crepitus or joint limitation with passive/active ROM.  Neurological: Sensation intact 5/5 bilaterally with 10 gram monofilament.  Vibratory sensation diminished  Assessment: 1. Painful onychomycosis toenails 1-5 b/l 2. Calluses submetatarsal head 1 left foot, fifth metatarsal base bilaterally 3. NIDDM with Diabetic neuropathy  Plan: 1. Continue diabetic foot care principles. Literature dispensed on  today. 2. Toenails 1-5 b/l were debrided in length and girth without iatrogenic bleeding.Offending nail borders debrided and curretaged b/l great toes. Borders cleansed with alcohol. Antibiotic ointment applied. No further treatment required by patient. 3. Calluses pared  submetatarsal head 1 left foot, fifth metatarsal base bilaterally utilizing sterile scalpel blade without incident.  4. Patient to continue soft, supportive shoe gear daily.  She does qualify for diabetic shoes based on examination today.  Supporting diagnosis include hallux abductovalgus with bunion deformity and diabetes with neuropathy.Per Medicare guidelines, patient's feet need to be evaluated by an MD/DO managing patient's diabetes and diabetic shoe certification form needs to be signed by the MD/DO. If patient's diabetes is being managed by an Endocrinologist, the Endocrinologist must evaluate patient's feet and sign the Medicare diabetic shoe certification form. 5. Patient to report any pedal injuries to medical professional. 6. Follow up 3 months.  7. Patient/POA to call should there be a concern in the interim.

## 2018-11-10 NOTE — Progress Notes (Signed)

## 2019-01-13 ENCOUNTER — Other Ambulatory Visit: Payer: Self-pay

## 2019-01-13 ENCOUNTER — Ambulatory Visit: Payer: Medicare HMO | Admitting: Orthotics

## 2019-01-13 DIAGNOSIS — M2012 Hallux valgus (acquired), left foot: Secondary | ICD-10-CM

## 2019-01-13 DIAGNOSIS — M2010 Hallux valgus (acquired), unspecified foot: Secondary | ICD-10-CM

## 2019-01-13 DIAGNOSIS — M21619 Bunion of unspecified foot: Secondary | ICD-10-CM

## 2019-01-13 DIAGNOSIS — E1142 Type 2 diabetes mellitus with diabetic polyneuropathy: Secondary | ICD-10-CM

## 2019-01-13 DIAGNOSIS — L84 Corns and callosities: Secondary | ICD-10-CM

## 2019-01-13 DIAGNOSIS — M2011 Hallux valgus (acquired), right foot: Secondary | ICD-10-CM

## 2019-01-13 DIAGNOSIS — M2041 Other hammer toe(s) (acquired), right foot: Secondary | ICD-10-CM

## 2019-01-25 NOTE — Progress Notes (Signed)
Patient had appointment today for definitive and final diabetic shoe fitting and delivery.  Patient was seen by Betha, C.Ped, [facility].   The inserts fit well and accomplished full contact with the plantar surface of the foot bilateral; the shoes fit well and offered forefoot freedom, no noticible heel slippage, and good toe clearance w/ the insert in place.  Patient was advised to monitor of any skin irritation, breakdown.  Patient was satisfied with fit and function.   Dawn to put in charges. 

## 2019-02-03 ENCOUNTER — Ambulatory Visit: Payer: Medicare HMO | Admitting: Podiatry

## 2019-04-19 ENCOUNTER — Ambulatory Visit: Payer: Medicare HMO | Admitting: Podiatry

## 2019-05-06 IMAGING — RF DG ESOPHAGUS
6 series · 14 of 23 positions shown · non-contrast
Comparison: None.

CLINICAL DATA: Dysphagia

EXAM:
ESOPHOGRAM / BARIUM SWALLOW / BARIUM TABLET STUDY
TECHNIQUE: Combined double contrast and single contrast examination performed
using effervescent crystals, thick barium liquid, and thin barium
liquid. The patient was observed with fluoroscopy swallowing a 13 mm
barium sulphate tablet.
FLUOROSCOPY TIME:  Fluoroscopy Time:  1 minutes 18 seconds
Radiation Exposure Index (if provided by the fluoroscopic device):
299 mGy
Number of Acquired Spot Images: 0

[Series 1: sequence · 2 of 10 frames shown (1 of 4)]
[frame 2/10]
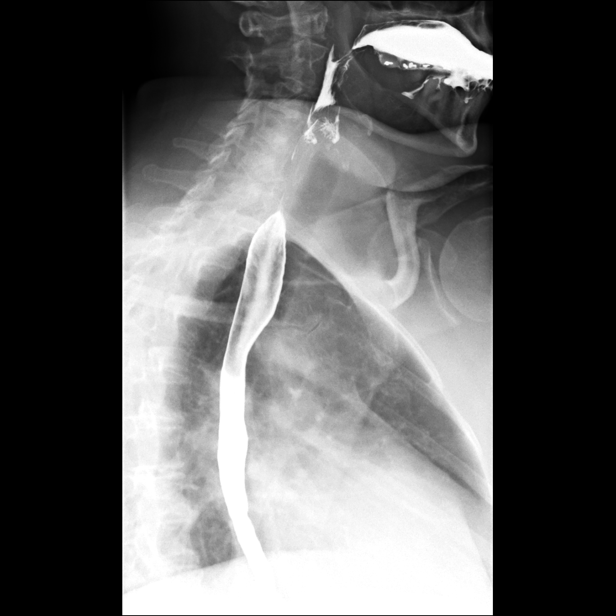
[frame 6/10]
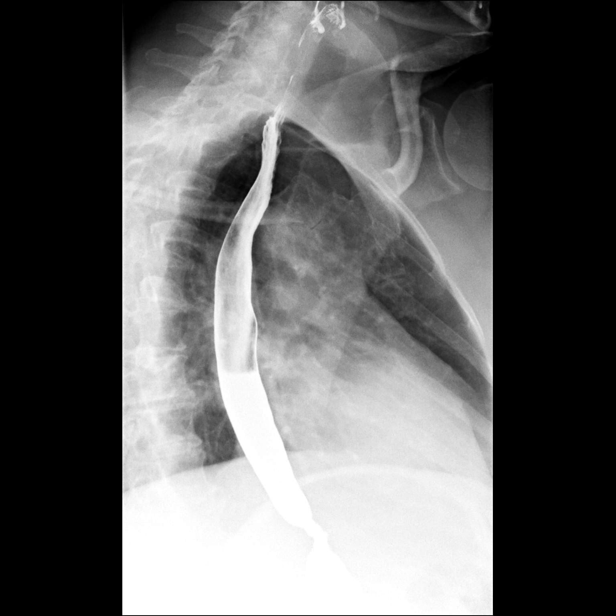

[Series 2: sequence · 3 of 26 frames shown (2 of 4)]
[frame 4/26]
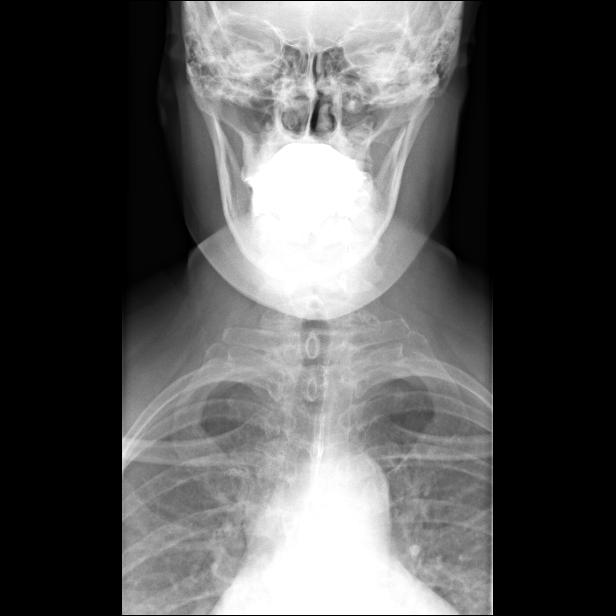
[frame 7/26]
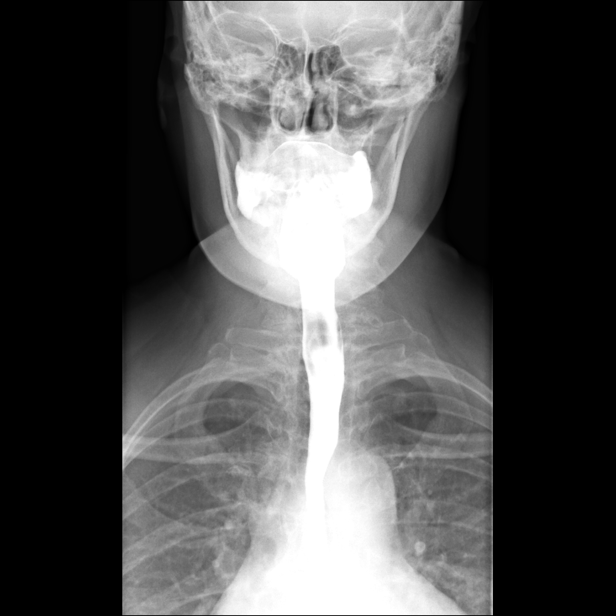
[frame 23/26]
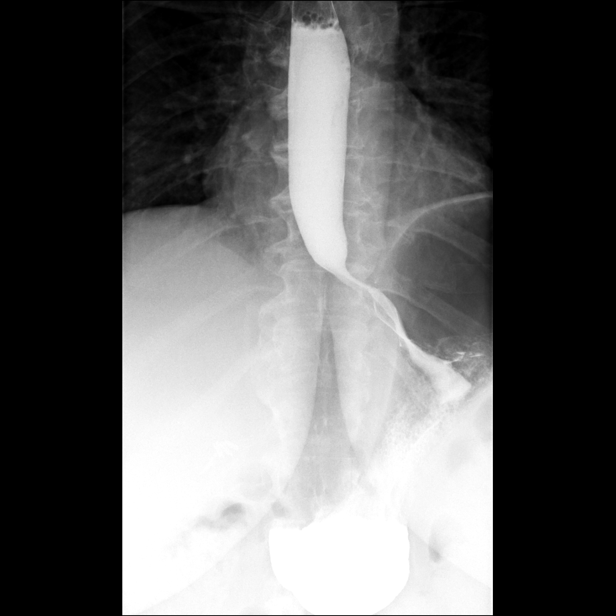

[Series 3: sequence · 2 of 16 frames shown (3 of 4)]
[frame 8/16]
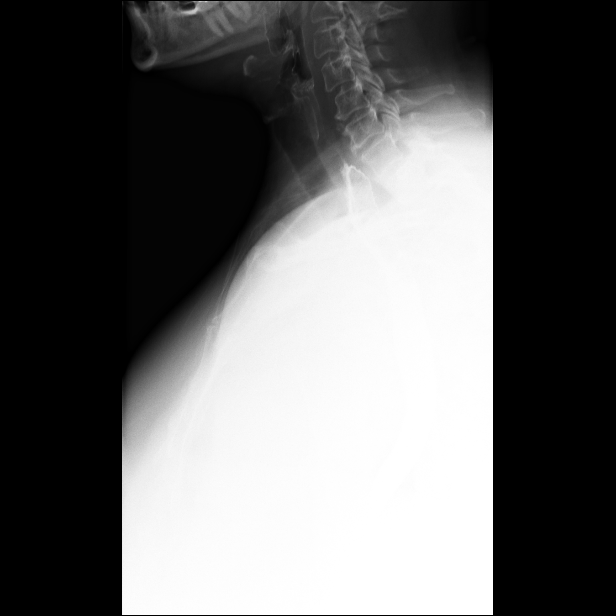
[frame 9/16]
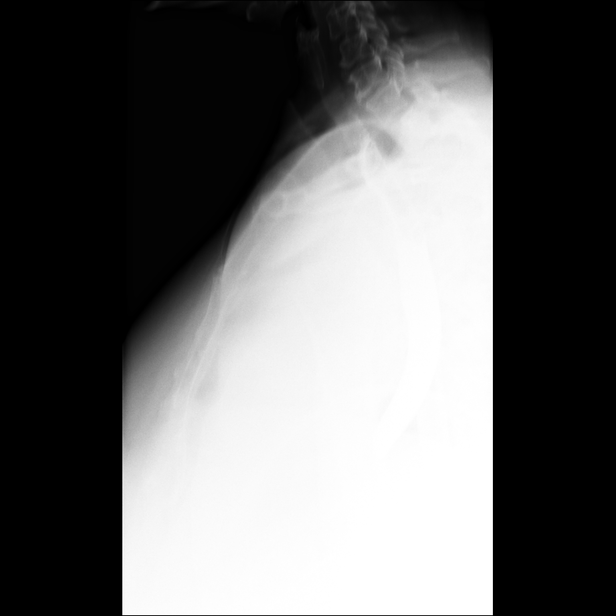

[Series 4: one shot · 3 of 4 slices shown (1 of 2)]
[im 1/4]
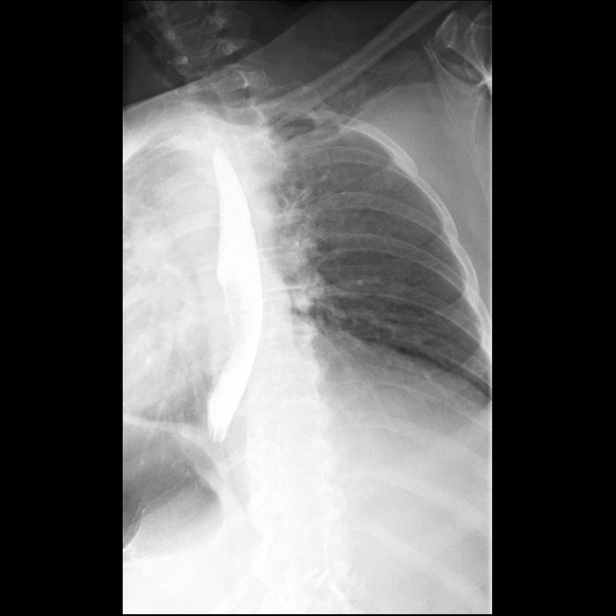
[im 2/4]
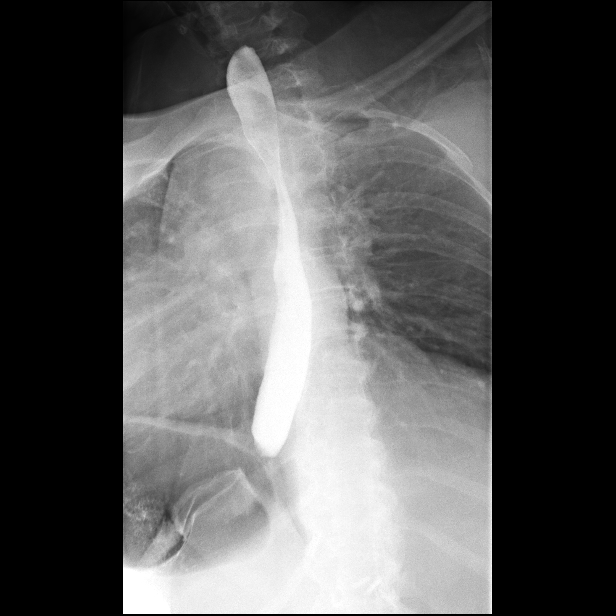
[im 4/4]
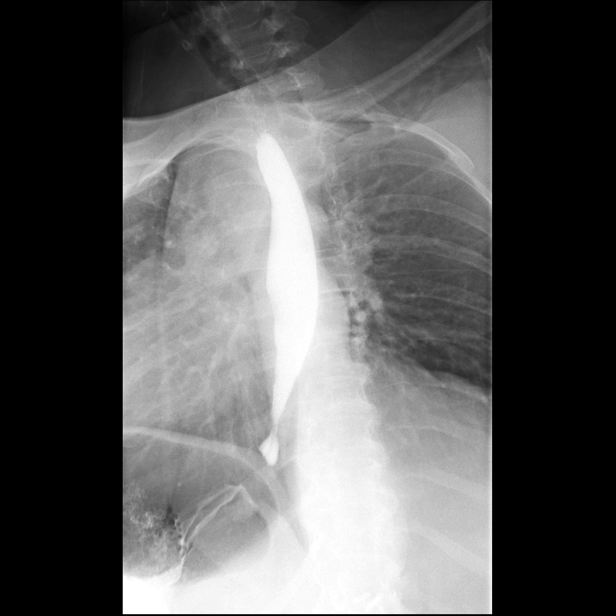

[Series 5: sequence · 2 of 55 frames shown (4 of 4)]
[frame 28/55]
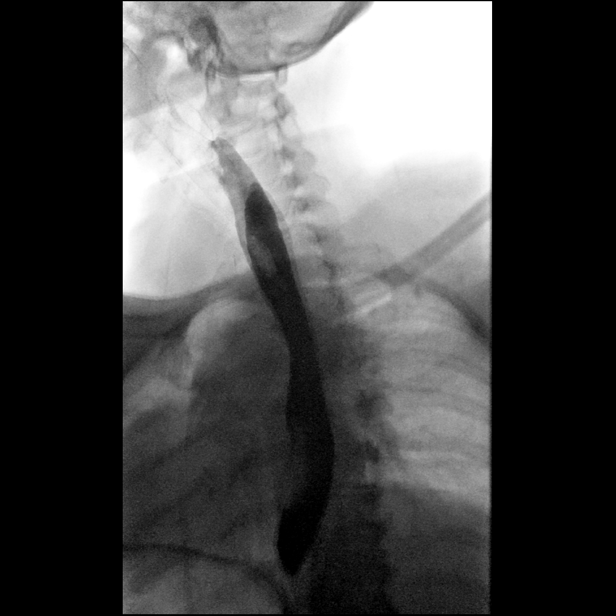
[frame 44/55]
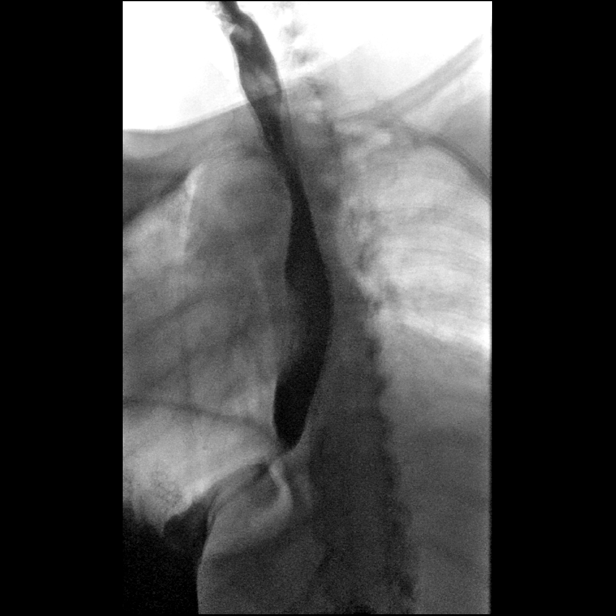

[Series 6: one shot · 2 of 3 slices shown (2 of 2)]
[im 1/3]
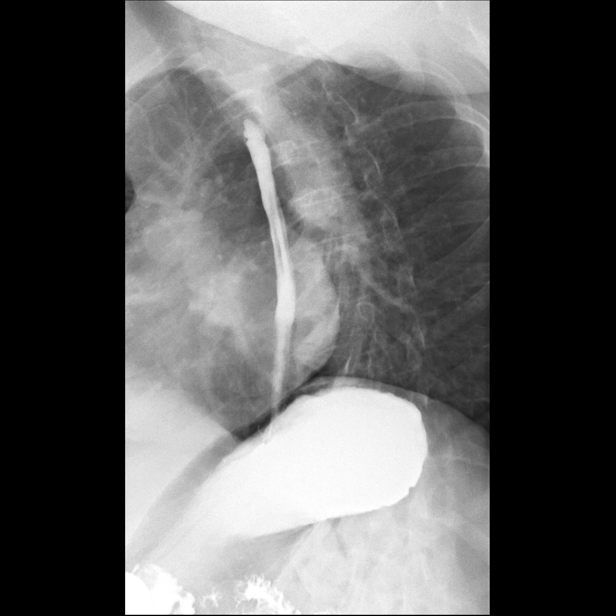
[im 3/3]
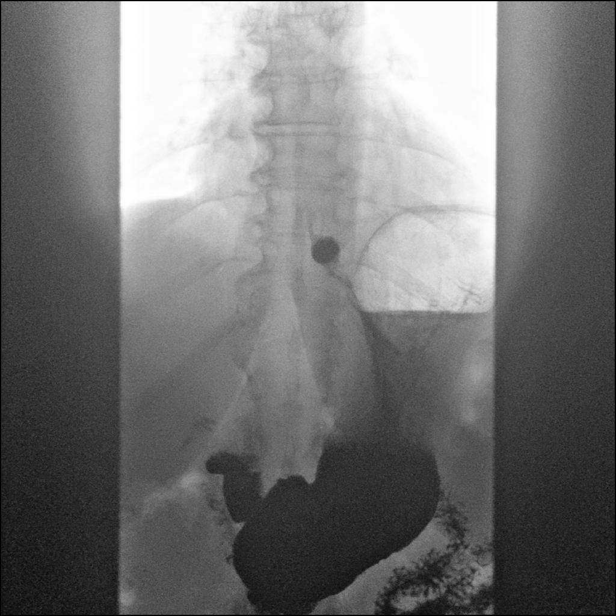

[14 of 23 positions shown; findings below may reference images not displayed]

FINDINGS: Initially a double-contrast barium swallow was performed. The mucosa
of the esophagus is unremarkable. There is an area of persistent
narrowing of the lower cervical esophagus. Rapid sequence spot films
of the cervical esophagus show the swallowing mechanism to be
normal. No intrinsic or extrinsic cervical esophageal abnormality is
seen. Peristaltic motion is normal. No hiatal hernia is noted.
Moderate gastroesophageal reflux is demonstrated. A barium tablet
was given at the end of the study which lodged just above the
gastroesophageal junction and did not pass after additional water
and barium were given. This indicates a short segment distal
esophageal stricture.
IMPRESSION: 1. Barium pill lodges just above the gastroesophageal junction
consistent with a short segment distal esophageal stricture.
2. Moderate gastroesophageal reflux.

## 2019-05-24 ENCOUNTER — Ambulatory Visit: Payer: Medicare HMO | Admitting: Podiatry

## 2019-06-27 ENCOUNTER — Other Ambulatory Visit: Payer: Self-pay

## 2019-06-27 ENCOUNTER — Ambulatory Visit (INDEPENDENT_AMBULATORY_CARE_PROVIDER_SITE_OTHER): Payer: Medicare HMO | Admitting: Podiatry

## 2019-06-27 ENCOUNTER — Encounter: Payer: Self-pay | Admitting: Podiatry

## 2019-06-27 ENCOUNTER — Ambulatory Visit (INDEPENDENT_AMBULATORY_CARE_PROVIDER_SITE_OTHER): Payer: Medicare HMO

## 2019-06-27 DIAGNOSIS — L84 Corns and callosities: Secondary | ICD-10-CM

## 2019-06-27 DIAGNOSIS — M7752 Other enthesopathy of left foot: Secondary | ICD-10-CM | POA: Diagnosis not present

## 2019-06-27 DIAGNOSIS — M898X9 Other specified disorders of bone, unspecified site: Secondary | ICD-10-CM | POA: Diagnosis not present

## 2019-06-27 DIAGNOSIS — M722 Plantar fascial fibromatosis: Secondary | ICD-10-CM

## 2019-06-27 DIAGNOSIS — M7750 Other enthesopathy of unspecified foot: Secondary | ICD-10-CM

## 2019-07-03 NOTE — Progress Notes (Signed)
Subjective: 59 year old female presents the office today for concerns of pain to the bottom of her left foot she points to the lateral aspect.  She states that it feels a good bone is piercing the bottom of the foot with walking.  She says it been intermittent for the last year but over the last few months has become more consistent.  Is worse in the morning when she first gets up.  No recent injury. Denies any systemic complaints such as fevers, chills, nausea, vomiting. No acute changes since last appointment, and no other complaints at this time.   Objective: AAO x3, NAD DP/PT pulses palpable bilaterally, CRT less than 3 seconds Prominence on the lateral aspect of the left foot on the fifth metatarsal base plantarly there is a hyperkeratotic lesion.  Upon debridement there is no ongoing ulceration drainage or any signs of infection.  No other areas of discomfort identified at this time. No open lesions or pre-ulcerative lesions.  No pain with calf compression, swelling, warmth, erythema  Assessment: Prominent fifth metatarsal base, tendinitis  Plan: -All treatment options discussed with the patient including all alternatives, risks, complications.  -X-ray reviewed and reviewed.  No evidence of acute fracture identified. -Steroid injection was performed to the area of tenderness.  Areas cleaned with alcohol and a mixture of half cc dexamethasone phosphate and 0.5 cc of Marcaine plain was infiltrated to the plantar fifth metatarsal base.  Care was taken to inject away from the peroneal tendon. -Debrided the callus without any complications or bleeding. -I offloaded her insert as well.  She will bring her of the inserts with her next appointment and we can modify this as well. -Patient encouraged to call the office with any questions, concerns, change in symptoms.    Vivi Barrack DPM

## 2019-07-28 ENCOUNTER — Ambulatory Visit: Payer: Medicare HMO | Admitting: Podiatry

## 2019-08-01 ENCOUNTER — Other Ambulatory Visit: Payer: Self-pay

## 2019-08-01 ENCOUNTER — Other Ambulatory Visit: Payer: Self-pay | Admitting: Podiatry

## 2019-08-01 ENCOUNTER — Ambulatory Visit (INDEPENDENT_AMBULATORY_CARE_PROVIDER_SITE_OTHER): Payer: Medicare HMO

## 2019-08-01 ENCOUNTER — Ambulatory Visit: Payer: Medicare HMO | Admitting: Podiatry

## 2019-08-01 VITALS — Temp 96.9°F

## 2019-08-01 DIAGNOSIS — M795 Residual foreign body in soft tissue: Secondary | ICD-10-CM

## 2019-08-01 DIAGNOSIS — M7750 Other enthesopathy of unspecified foot: Secondary | ICD-10-CM | POA: Diagnosis not present

## 2019-08-01 DIAGNOSIS — G8929 Other chronic pain: Secondary | ICD-10-CM

## 2019-08-01 DIAGNOSIS — M25572 Pain in left ankle and joints of left foot: Secondary | ICD-10-CM | POA: Diagnosis not present

## 2019-08-01 NOTE — Patient Instructions (Signed)
I have ordered an MRI of the left ankle to rule out a peronea tendon partial tear. If you do not hear for them about scheduling within the next 1 week, or you have any questions please give [place] a call at [phone].   For instructions on how to put on your Tri-Lock Ankle Brace, please visit [email or web address]

## 2019-08-06 NOTE — Progress Notes (Signed)
Subjective: 59 year old female presents the office today for follow-up evaluation of left foot pain.  She states that she continues to have pain to the area and some mild swelling.  No recent injury or falls.  She also has new concerns as she stepped on a piece of glass on Sunday.  She did pull a piece of glass out of her foot.  The area is still tender.  Denies any swelling or redness or any drainage. Denies any systemic complaints such as fevers, chills, nausea, vomiting. No acute changes since last appointment, and no other complaints at this time.   Objective: AAO x3, NAD DP/PT pulses palpable bilaterally, CRT less than 3 seconds To the plantar aspect the left heel is about a 1.5 cm superficial what appears to be healing laceration.  Trace edema.  There is no erythema.  No drainage of pus. There is still tenderness palpation the course the peroneal tendons inferior to lateral malleolus along the insertion of the fifth metatarsal base.  Overall it appears to be intact there is minimal edema.  Mild ingrowing of hallux toenails with any significant pain today.  No redness or drainage or any signs of infection. no open lesions or pre-ulcerative lesions.  No pain with calf compression, swelling, warmth, erythema  Assessment: Continued pain on the course the peroneal tendon, rule out partial tear; foreign body left foot  Plan: -All treatment options discussed with the patient including all alternatives, risks, complications.  -X-rays obtained reviewed.  No evidence of acute foreign body identified or evidence of acute fracture. -Not able to see any foreign body identified.  Prescribed Keflex.  Antibiotic ointment dressing changes daily. -Due to continued pain recommended MRI, partial tear of the peroneal tendon.  This is for potential surgical planning.  Continue Tri-Lock ankle brace. -Patient encouraged to call the office with any questions, concerns, change in symptoms.   Vivi Barrack  DPM

## 2019-08-07 ENCOUNTER — Telehealth: Payer: Self-pay | Admitting: *Deleted

## 2019-08-07 DIAGNOSIS — M7750 Other enthesopathy of unspecified foot: Secondary | ICD-10-CM

## 2019-08-07 DIAGNOSIS — T148XXA Other injury of unspecified body region, initial encounter: Secondary | ICD-10-CM

## 2019-08-07 DIAGNOSIS — G8929 Other chronic pain: Secondary | ICD-10-CM

## 2019-08-07 DIAGNOSIS — M25572 Pain in left ankle and joints of left foot: Secondary | ICD-10-CM

## 2019-08-07 NOTE — Telephone Encounter (Signed)
Orders to L. Cox, CMA for pre-cert, faxed to [facility]. 

## 2019-08-07 NOTE — Telephone Encounter (Signed)
-----   Message from Vivi Barrack, DPM sent at 08/06/2019  2:25 PM EDT ----- Can you please order an MRI to rule out partial tear of the peroneal tendon of the left? Thanks.

## 2019-08-09 ENCOUNTER — Telehealth: Payer: Self-pay | Admitting: *Deleted

## 2019-08-09 NOTE — Telephone Encounter (Signed)
Called and spoke with Elmyra Ricks from [employer] at  [phone] and the representative stated that the procedure code 54627 does need prior authorization and the authorization number is [id number] and is valid 08-09-2019 thru 09-08-2019. Misty Stanley

## 2019-08-09 NOTE — Telephone Encounter (Signed)
Spoke with Valora Corporal yesterday at [facility] and representative stated that the patient's insurance has not been active since march 18th, 2021 and Monia Pouch has not let [employer] know if the patient is still active or not and per representative will start a case to see if the patient is still eligible for insurance and we should hear back within the next two days and the plan may have changed and [employer] is the third party in this case and the case number is [id number] and [employer]'s number is [phone]. Misty Stanley

## 2019-09-06 NOTE — Telephone Encounter (Signed)
[employer] change expiration date to 06/31/2021, Authorization: [id number] remains the same.

## 2019-09-06 NOTE — Telephone Encounter (Signed)
I informed [facility] of the change of expiration to 11/08/2019.

## 2019-09-06 NOTE — Telephone Encounter (Signed)
[facility] states pt is scheduled for 09/09/2019, and Authorization expiration is 09/08/2019.

## 2019-09-09 ENCOUNTER — Ambulatory Visit
Admission: RE | Admit: 2019-09-09 | Discharge: 2019-09-09 | Disposition: A | Discharge status: Home / Self Care | Payer: Medicare HMO | Source: Ambulatory Visit | Attending: Podiatry | Admitting: Podiatry

## 2019-09-09 ENCOUNTER — Other Ambulatory Visit: Payer: Self-pay

## 2019-09-09 DIAGNOSIS — T148XXA Other injury of unspecified body region, initial encounter: Secondary | ICD-10-CM

## 2019-09-09 DIAGNOSIS — M7750 Other enthesopathy of unspecified foot: Secondary | ICD-10-CM

## 2019-09-09 DIAGNOSIS — G8929 Other chronic pain: Secondary | ICD-10-CM

## 2019-09-15 ENCOUNTER — Telehealth: Payer: Self-pay | Admitting: Podiatry

## 2019-09-15 ENCOUNTER — Other Ambulatory Visit: Payer: Self-pay | Admitting: Podiatry

## 2019-09-15 MED ORDER — DICLOFENAC SODIUM 1 % EX GEL: 2.0000 g | Freq: Four times a day (QID) | CUTANEOUS | 2 refills | Status: AC

## 2019-09-15 NOTE — Telephone Encounter (Signed)
left message for pt to call to get an appt to see Rick/Betha to possibly trim down her inserts for pt per Dr Ardelle Anton when he spoke to pt she stated they were pushing up on her foot.

## 2019-10-18 ENCOUNTER — Telehealth: Payer: Self-pay | Admitting: Podiatry

## 2019-10-18 NOTE — Telephone Encounter (Signed)
Pt left message stating she was needing an appt to trim down her shoes or something. She had an mri and was seen by Dr Ardelle Anton.   I returned call and left message stating that we need her to schedule an appt to trim down her inserts that she had and possibly get an new pair of shoes if she is interested.

## 2020-03-05 ENCOUNTER — Other Ambulatory Visit: Payer: Self-pay

## 2020-03-05 ENCOUNTER — Ambulatory Visit: Payer: Medicare HMO | Admitting: Podiatry

## 2020-03-05 DIAGNOSIS — L600 Ingrowing nail: Secondary | ICD-10-CM | POA: Diagnosis not present

## 2020-03-05 DIAGNOSIS — M722 Plantar fascial fibromatosis: Secondary | ICD-10-CM

## 2020-03-05 DIAGNOSIS — M7750 Other enthesopathy of unspecified foot: Secondary | ICD-10-CM | POA: Diagnosis not present

## 2020-03-05 MED ORDER — DOXYCYCLINE HYCLATE 100 MG PO TABS: 100.0000 mg | ORAL_TABLET | Freq: Two times a day (BID) | ORAL | 0 refills | Status: DC

## 2020-03-05 NOTE — Patient Instructions (Signed)
Soak Instructions- MAKE SURE THE TEMPERATURE OF THE WATER IS NOT TOO HOT OR COLD- HAVE SOMEONE ELSE CHECK THE TEMP OF THE WATER.    THE DAY AFTER THE PROCEDURE  Place 1/4 cup of epsom salts in a quart of warm tap water.  Submerge your foot or feet with outer bandage intact for the initial soak; this will allow the bandage to become moist and wet for easy lift off.  Once you remove your bandage, continue to soak in the solution for 10 minutes.  This soak should be done twice a day.  Next, remove your foot or feet from solution, blot dry the affected area and cover.  You may use a band aid large enough to cover the area or use gauze and tape.  Apply other medications to the area as directed by the doctor such as polysporin neosporin.  IF YOUR SKIN BECOMES IRRITATED WHILE USING THESE INSTRUCTIONS, IT IS OKAY TO SWITCH TO  WHITE VINEGAR AND WATER. Or you may use antibacterial soap and water to keep the toe clean  Monitor for any signs/symptoms of infection. Call the office immediately if any occur or go directly to the emergency room. Call with any questions/concerns.

## 2020-03-09 NOTE — Progress Notes (Signed)
Subjective: 59 year old female presents the office today for concerns mostly to her right big toe becoming thickened discolored dystrophic.  Ingrowing toenails.  She denies redness or drainage or any swelling.  In regards to her foot pain she states that she still has discomfort but she is not able to start physical therapy but she states that she knows she needs to start this.  She did try to wear her shoes.  Her main complaint today is the toenails. Denies any systemic complaints such as fevers, chills, nausea, vomiting. No acute changes since last appointment, and no other complaints at this time.   Objective: AAO x3, NAD DP/PT pulses palpable bilaterally, CRT less than 3 seconds Incurvation present on the hallux toenails with mild edema and erythema on the nail borders but there is no ascending cellulitis.  Tenderness palpation of nails.  The nails are mildly dystrophic, hypertrophic.  In general there is discomfort along the insertion of plantar fascia.  There is no area pinpoint tenderness.  There is no significant erythema to the foot.  MMT 5/5. No pain with calf compression, swelling, warmth, erythema  Assessment: 59 year old female with ingrown toenail, chronic foot pain  Plan: -All treatment options discussed with the patient including all alternatives, risks, complications.  -I discussed with her partial nail avulsion with chemical matricectomy.  She is to consider doing this but she does not do it today.  She can do Epson salt soaks and cover with a small mount of antibiotic ointment.  Prescribed doxycycline. -Continue with stretching, icing as well as supportive shoe gear.  She will start physical therapy. -Patient encouraged to call the office with any questions, concerns, change in symptoms.   Vivi Barrack DPM

## 2020-03-19 ENCOUNTER — Other Ambulatory Visit: Payer: Self-pay

## 2020-03-19 ENCOUNTER — Ambulatory Visit: Payer: Medicare HMO | Admitting: Podiatry

## 2020-03-19 DIAGNOSIS — M79674 Pain in right toe(s): Secondary | ICD-10-CM | POA: Diagnosis not present

## 2020-03-19 DIAGNOSIS — B351 Tinea unguium: Secondary | ICD-10-CM | POA: Diagnosis not present

## 2020-03-19 DIAGNOSIS — L600 Ingrowing nail: Secondary | ICD-10-CM | POA: Diagnosis not present

## 2020-03-19 MED ORDER — CEPHALEXIN 500 MG PO CAPS: 500.0000 mg | ORAL_CAPSULE | Freq: Three times a day (TID) | ORAL | 0 refills | Status: DC

## 2020-03-19 NOTE — Patient Instructions (Signed)

## 2020-03-26 NOTE — Progress Notes (Signed)
Subjective: 59 year old female presents the office today for concerns of chronic ingrown toenails of the big toes of the right side worse than left.  She wants to go and have the procedure performed at the bedside today.  Denies any drainage or pus any swelling to the toenail sites.  Regards to her foot pain she has not yet started physical therapy.  She presents today for toenail issues. Denies any systemic complaints such as fevers, chills, nausea, vomiting. No acute changes since last appointment, and no other complaints at this time.   Objective: AAO x3, NAD DP/PT pulses palpable bilaterally, CRT less than 3 seconds Incurvation present to bilateral hallux nails with the right side worse than left on both medial lateral nail borders.  There is minimal edema there is no erythema, drainage or possibly signs of infection.  Nails are hypertrophic, dystrophic, discolored.  There is yellow-brown discoloration of the nails.  There is no open lesions.   No pain with calf compression, swelling, warmth, erythema  Assessment: Ingrown toenails right side worse than left   Plan: -All treatment options discussed with the patient including all alternatives, risks, complications.  -At this time, the patient is requesting partial nail removal with chemical matricectomy to the symptomatic portion of the right hallux toenail. Risks and complications were discussed with the patient for which they understand and written consent was obtained. Under sterile conditions a total of 3 mL of a mixture of 2% lidocaine plain and 0.5% Marcaine plain was infiltrated in a hallux block fashion. Once anesthetized, the skin was prepped in sterile fashion. A tourniquet was then applied. Next the medial and lateral aspect of hallux nail border was then sharply excised making sure to remove the entire offending nail border. Once the nails were ensured to be removed area was debrided and the underlying skin was intact. There is no  purulence identified in the procedure. Next phenol was then applied under standard conditions and copiously irrigated. Silvadene was applied. A dry sterile dressing was applied. After application of the dressing the tourniquet was removed and there is found to be an immediate capillary refill time to the digit. The patient tolerated the procedure well any complications. Post procedure instructions were discussed the patient for which he verbally understood. Follow-up in one week for nail check or sooner if any problems are to arise. Discussed signs/symptoms of infection and directed to call the office immediately should any occur or go directly to the emergency room. In the meantime, encouraged to call the office with any questions, concerns, changes symptoms. -Keflex -Discussed possible treatment for nail fungus after procedure site is healed.  Ashley Crane DPM

## 2020-04-03 ENCOUNTER — Other Ambulatory Visit: Payer: Medicare HMO | Admitting: Orthotics

## 2020-04-11 ENCOUNTER — Ambulatory Visit: Payer: Medicare HMO | Admitting: Orthotics

## 2020-04-11 ENCOUNTER — Other Ambulatory Visit: Payer: Self-pay

## 2020-04-11 ENCOUNTER — Ambulatory Visit: Payer: Medicare HMO | Admitting: Podiatry

## 2020-04-11 ENCOUNTER — Telehealth: Payer: Self-pay | Admitting: Podiatry

## 2020-04-11 DIAGNOSIS — M722 Plantar fascial fibromatosis: Secondary | ICD-10-CM

## 2020-04-11 DIAGNOSIS — L600 Ingrowing nail: Secondary | ICD-10-CM | POA: Diagnosis not present

## 2020-04-11 DIAGNOSIS — M79675 Pain in left toe(s): Secondary | ICD-10-CM

## 2020-04-11 DIAGNOSIS — L84 Corns and callosities: Secondary | ICD-10-CM

## 2020-04-11 DIAGNOSIS — E1142 Type 2 diabetes mellitus with diabetic polyneuropathy: Secondary | ICD-10-CM

## 2020-04-11 MED ORDER — DOXYCYCLINE HYCLATE 100 MG PO TABS: 100.0000 mg | ORAL_TABLET | Freq: Two times a day (BID) | ORAL | 0 refills | Status: DC

## 2020-04-11 NOTE — Telephone Encounter (Signed)
sent 

## 2020-04-11 NOTE — Progress Notes (Signed)

## 2020-04-11 NOTE — Telephone Encounter (Signed)
Patient called in asking if you were going to send antibiotics for the ingrown toenail that was removed today, also stated she has finished the antibiotics already from previous visit, please advise

## 2020-04-11 NOTE — Patient Instructions (Signed)

## 2020-04-16 NOTE — Progress Notes (Signed)
Subjective: 59 year old female presents the office today for follow evaluation after undergoing right partial nail avulsion.  She wants to have the left side done today as well.  The right side is been doing well.  No pain, drainage.  She still been soaking Epson salts. The left big toenail still becoming tender along the nail borders and she like to have the nails borders removed today. Denies any systemic complaints such as fevers, chills, nausea, vomiting. No acute changes since last appointment, and no other complaints at this time.   Objective: AAO x3, NAD DP/PT pulses palpable bilaterally, CRT less than 3 seconds Status post partial nail avulsion right hallux toenail which appears to be healing well.  Scab is present without any edema, erythema, drainage or possibly signs of infection.  The left hallux toenail there is incurvation of both the medial lateral nail borders with tenderness palpation of the nail borders with localized edema.  There is no drainage or pus.  No ascending cellulitis.  No pain with calf compression, swelling, warmth, erythema  Assessment: Ingrown toenail left hallux  Plan: -All treatment options discussed with the patient including all alternatives, risks, complications.  -Right side is doing well.  Continue soaking in Epson salts, and antibiotic ointment during the day.  Leave the area open at night. -At this time, the patient is requesting partial nail removal with chemical matricectomy to the symptomatic portion of the nail. Risks and complications were discussed with the patient for which they understand and written consent was obtained. Under sterile conditions a total of 3 mL of a mixture of 2% lidocaine plain and 0.5% Marcaine plain was infiltrated in a hallux block fashion. Once anesthetized, the skin was prepped in sterile fashion. A tourniquet was then applied. Next the left medial, lateral aspect of hallux nail border was then sharply excised making sure to  remove the entire offending nail border. Once the nails were ensured to be removed area was debrided and the underlying skin was intact. There is no purulence identified in the procedure. Next phenol was then applied under standard conditions and copiously irrigated. Silvadene was applied. A dry sterile dressing was applied. After application of the dressing the tourniquet was removed and there is found to be an immediate capillary refill time to the digit. The patient tolerated the procedure well any complications. Post procedure instructions were discussed the patient for which he verbally understood. Follow-up in one week for nail check or sooner if any problems are to arise. Discussed signs/symptoms of infection and directed to call the office immediately should any occur or go directly to the emergency room. In the meantime, encouraged to call the office with any questions, concerns, changes symptoms. Raiford Noble for inserts -Patient encouraged to call the office with any questions, concerns, change in symptoms.   Vivi Barrack DPM

## 2020-07-18 DIAGNOSIS — I10 Essential (primary) hypertension: Secondary | ICD-10-CM | POA: Insufficient documentation

## 2020-07-18 DIAGNOSIS — E119 Type 2 diabetes mellitus without complications: Secondary | ICD-10-CM | POA: Insufficient documentation

## 2020-07-29 ENCOUNTER — Telehealth: Payer: Self-pay | Admitting: Podiatry

## 2020-07-29 NOTE — Telephone Encounter (Signed)
Pt left message asking if her diabetic shoes were in..  I returned call and left message that the paperwork we received was not clear and that is what is holding up the shoes and inserts. I told pt I would fax it with a note to Dr Renae Gloss to get it updated.Marland Kitchen

## 2020-07-31 DIAGNOSIS — D649 Anemia, unspecified: Secondary | ICD-10-CM | POA: Insufficient documentation

## 2020-08-11 ENCOUNTER — Other Ambulatory Visit: Payer: Self-pay

## 2020-08-11 ENCOUNTER — Emergency Department (HOSPITAL_COMMUNITY)
Admission: EM | Admit: 2020-08-11 | Discharge: 2020-08-12 | Disposition: A | Discharge status: Home / Self Care | Payer: Medicare HMO | Attending: Emergency Medicine | Admitting: Emergency Medicine

## 2020-08-11 ENCOUNTER — Encounter (HOSPITAL_COMMUNITY): Payer: Self-pay | Admitting: Emergency Medicine

## 2020-08-11 DIAGNOSIS — Z7982 Long term (current) use of aspirin: Secondary | ICD-10-CM | POA: Insufficient documentation

## 2020-08-11 DIAGNOSIS — T40711A Poisoning by cannabis, accidental (unintentional), initial encounter: Secondary | ICD-10-CM | POA: Insufficient documentation

## 2020-08-11 DIAGNOSIS — Z794 Long term (current) use of insulin: Secondary | ICD-10-CM | POA: Diagnosis not present

## 2020-08-11 DIAGNOSIS — R739 Hyperglycemia, unspecified: Secondary | ICD-10-CM

## 2020-08-11 DIAGNOSIS — R112 Nausea with vomiting, unspecified: Secondary | ICD-10-CM | POA: Diagnosis not present

## 2020-08-11 DIAGNOSIS — Z79899 Other long term (current) drug therapy: Secondary | ICD-10-CM | POA: Insufficient documentation

## 2020-08-11 DIAGNOSIS — T50901A Poisoning by unspecified drugs, medicaments and biological substances, accidental (unintentional), initial encounter: Secondary | ICD-10-CM | POA: Insufficient documentation

## 2020-08-11 DIAGNOSIS — T887XXA Unspecified adverse effect of drug or medicament, initial encounter: Secondary | ICD-10-CM | POA: Insufficient documentation

## 2020-08-11 DIAGNOSIS — J45909 Unspecified asthma, uncomplicated: Secondary | ICD-10-CM | POA: Diagnosis not present

## 2020-08-11 DIAGNOSIS — E1165 Type 2 diabetes mellitus with hyperglycemia: Secondary | ICD-10-CM | POA: Diagnosis not present

## 2020-08-11 DIAGNOSIS — R55 Syncope and collapse: Secondary | ICD-10-CM | POA: Diagnosis not present

## 2020-08-11 DIAGNOSIS — R531 Weakness: Secondary | ICD-10-CM | POA: Insufficient documentation

## 2020-08-11 DIAGNOSIS — T6591XA Toxic effect of unspecified substance, accidental (unintentional), initial encounter: Secondary | ICD-10-CM

## 2020-08-11 DIAGNOSIS — I10 Essential (primary) hypertension: Secondary | ICD-10-CM | POA: Insufficient documentation

## 2020-08-11 LAB — CBC WITH DIFFERENTIAL/PLATELET
Abs Immature Granulocytes: 0.01 10*3/uL (ref 0.00–0.07)
Basophils Absolute: 0 10*3/uL (ref 0.0–0.1)
Basophils Relative: 0 %
Eosinophils Absolute: 0 10*3/uL (ref 0.0–0.5)
Eosinophils Relative: 0 %
HCT: 38.5 % (ref 36.0–46.0)
Hemoglobin: 12.9 g/dL (ref 12.0–15.0)
Immature Granulocytes: 0 %
Lymphocytes Relative: 22 %
Lymphs Abs: 1.9 10*3/uL (ref 0.7–4.0)
MCH: 30.6 pg (ref 26.0–34.0)
MCHC: 33.5 g/dL (ref 30.0–36.0)
MCV: 91.4 fL (ref 80.0–100.0)
Monocytes Absolute: 0.5 10*3/uL (ref 0.1–1.0)
Monocytes Relative: 5 %
Neutro Abs: 6 10*3/uL (ref 1.7–7.7)
Neutrophils Relative %: 73 %
Platelets: 328 10*3/uL (ref 150–400)
RBC: 4.21 MIL/uL (ref 3.87–5.11)
RDW: 13 % (ref 11.5–15.5)
WBC: 8.4 10*3/uL (ref 4.0–10.5)
nRBC: 0 % (ref 0.0–0.2)

## 2020-08-11 LAB — COMPREHENSIVE METABOLIC PANEL
ALT: 28 U/L (ref 0–44)
AST: 24 U/L (ref 15–41)
Albumin: 4.1 g/dL (ref 3.5–5.0)
Alkaline Phosphatase: 81 U/L (ref 38–126)
Anion gap: 9 (ref 5–15)
BUN: 12 mg/dL (ref 6–20)
CO2: 27 mmol/L (ref 22–32)
Calcium: 9.7 mg/dL (ref 8.9–10.3)
Chloride: 100 mmol/L (ref 98–111)
Creatinine, Ser: 0.82 mg/dL (ref 0.44–1.00)
GFR, Estimated: >60 mL/min (ref >60)
Glucose, Bld: 347 mg/dL — ABNORMAL HIGH (ref 70–99)
Potassium: 3.4 mmol/L — ABNORMAL LOW (ref 3.5–5.1)
Sodium: 136 mmol/L (ref 135–145)
Total Bilirubin: 0.5 mg/dL (ref 0.3–1.2)
Total Protein: 7.4 g/dL (ref 6.5–8.1)

## 2020-08-11 LAB — LIPASE, BLOOD: Lipase: 31 U/L (ref 11–51)

## 2020-08-11 MED ORDER — ONDANSETRON 4 MG PO TBDP
8.0000 mg | ORAL_TABLET | Freq: Once | ORAL | Status: AC
  Administered 2020-08-11: 8 mg via ORAL
  Filled 2020-08-11: qty 2

## 2020-08-11 NOTE — ED Provider Notes (Signed)
MSE was initiated and I personally evaluated the patient and placed orders (if any) at  7:17 PM on August 11, 2020.  Patient placed in Quick Look pathway, seen and evaluated   Chief Complaint: NV  HPI:   Nausea and vomiting began today after eating what may have been a marijuana containing edible.  She states she feels weird and off-balance feels sleepy denies any pain but does endorse nausea and vomiting.  She states she has vomited 4-5 times.  She is currently retching.  ROS: Nausea, vomiting, fatigue/sleepiness (one)  Physical Exam:   Gen: No distress  Neuro: Awake and Alert  Skin: Warm    Focused Exam: Patient is oriented x3, actively retching, appears uncomfortable, no abdominal tenderness with soft/light and deep palpation of the abdomen.  No guarding or rebound.  No CVA tenderness.  Lung clear to auscultation all fields.  Heart regular rate.   Initiation of care has begun. The patient has been counseled on the process, plan, and necessity for staying for the completion/evaluation, and the remainder of the medical screening examination   The patient appears stable so that the remainder of the MSE may be completed by another provider.   Solon Augusta [place], [place] 08/11/20 1921    Koleen Distance, MD 08/11/20 Ernestina [place]

## 2020-08-11 NOTE — ED Triage Notes (Signed)
Pt to triage via [facility] from home.  Pt ate a cookie at a friend's house that may have had edible marijuana.  Reports feeling weird.  States she feels sleepy.  Denies pain.

## 2020-08-12 LAB — RAPID URINE DRUG SCREEN, HOSP PERFORMED
Amphetamines: NOT DETECTED
Barbiturates: NOT DETECTED
Benzodiazepines: NOT DETECTED
Cocaine: NOT DETECTED
Opiates: NOT DETECTED
Tetrahydrocannabinol: POSITIVE — AB

## 2020-08-12 NOTE — ED Provider Notes (Signed)
[facility] EMERGENCY DEPARTMENT Provider Note   CSN: [id number] Arrival date & time: 08/11/20  6384     History Chief Complaint  Patient presents with  . Ingestion    Ashley Crane is a 60 y.o. female.  HPI    Patient history of diabetes, hypertension, hyperlipidemia presents for nausea vomiting and "feeling weird " Patient reports she was at a relatives house when she ate a cookie and about 30 minutes later she began feeling very different.  She reports that she felt that her heart stopped and she may have had a syncopal episode but did not fall or injure herself.  She also reports nausea and vomiting.  initial suspicion that the cookie may have had marijuana in it. She had mild chest tightness but is improved She also suspects that this may be related to elevated blood sugar.  She is now feeling improved but does feel tired  her course is improving. Past Medical History:  Diagnosis Date  . Diabetes mellitus without complication (HCC)   . Hyperlipidemia   . Hypertension     Patient Active Problem List   Diagnosis Date Noted  . Allergic rhinitis 05/24/2018  . Benign essential hypertension 02/25/2018  . Hyperlipidemia 02/25/2018  . Insomnia 02/25/2018  . Obesity 02/25/2018  . Osteoarthritis of knee 02/25/2018  . Sciatica 02/25/2018  . Shoulder pain 02/25/2018  . Vitamin D deficiency 02/25/2018  . Asthma 02/24/2017  . Constipation 02/24/2017    Past Surgical History:  Procedure Laterality Date  . BACK SURGERY  2007  . CHOLECYSTECTOMY       OB History   No obstetric history on file.     Family History  Problem Relation Age of Onset  . Hyperlipidemia Other   . Hypertension Other   . Diabetes Other   . Heart disease Other   . Sleep apnea Other     Social History   Tobacco Use  . Smoking status: Never Smoker  . Smokeless tobacco: Never Used  Substance Use Topics  . Alcohol use: No  . Drug use: No    Home Medications Prior to  Admission medications   Medication Sig Start Date End Date Taking? Authorizing Provider  amLODipine (NORVASC) 10 MG tablet Take 10 mg by mouth daily.   Yes [provider]  aspirin 81 MG chewable tablet Chew 81 mg by mouth daily. 08/21/14  Yes [provider]  atorvastatin (LIPITOR) 40 MG tablet Take 40 mg by mouth daily.   Yes [provider]  Continuous Blood Gluc Sensor (FREESTYLE LIBRE 14 DAY SENSOR) MISC USE AS DIRECTED TO CHECK BLOOD SUGAR 02/26/18  Yes [provider]  diclofenac Sodium (VOLTAREN) 1 % GEL Apply 2 g topically 4 (four) times daily. Rub into affected area of foot 2 to 4 times daily 09/15/19  Yes Vivi Barrack, DPM  gabapentin (NEURONTIN) 300 MG capsule Take 300 mg by mouth daily as needed for pain.   Yes [provider]  Insulin Glargine-Lixisenatide 100-33 UNT-MCG/ML SOPN Inject 60 Units into the skin daily.   Yes [provider]  linaclotide (LINZESS) 72 MCG capsule Take 72 mcg by mouth daily as needed (constipation).   Yes [provider]  meloxicam (MOBIC) 15 MG tablet Take 15 mg by mouth daily. 06/18/18  Yes [provider]  metFORMIN (GLUCOPHAGE) 1000 MG tablet Take 1,000 mg by mouth 2 (two) times daily. 03/15/18  Yes [provider]  omeprazole (PRILOSEC) 40 MG capsule Take 40  mg by mouth daily.   Yes [provider]  traMADol (ULTRAM) 50 MG tablet Take 50 mg by mouth every 6 (six) hours as needed for moderate pain. 07/25/18  Yes [provider]  valsartan-hydrochlorothiazide (DIOVAN-HCT) 320-25 MG per tablet Take 1 tablet by mouth daily.   Yes [provider]    Allergies    Oxycodone hcl  Review of Systems   Review of Systems  Constitutional: Positive for fatigue. Negative for fever.  Cardiovascular: Negative for chest pain.  Gastrointestinal: Positive for nausea and vomiting.  Neurological: Positive for syncope.  All other systems reviewed and are  negative.   Physical Exam Updated Vital Signs BP (!) 154/88 (BP Location: Left Arm)   Pulse (!) 56   Temp 98 F (36.7 C) (Oral)   Resp 16   SpO2 97%   Physical Exam CONSTITUTIONAL: Well developed/well nourished HEAD: Normocephalic/atraumatic EYES: EOMI/PERRL, no nystagmus,no ptosis ENMT: Mucous membranes moist NECK: supple no meningeal signs CV: S1/S2 noted, no murmurs/rubs/gallops noted LUNGS: Lungs are clear to auscultation bilaterally, no apparent distress ABDOMEN: soft, nontender, no rebound or guarding GU:no cva tenderness NEURO:Awake/alert, face symmetric, no arm or leg drift is noted Equal 5/5 strength with shoulder abduction, elbow flex/extension, wrist flex/extension in upper extremities and equal hand grips bilaterally Equal 5/5 strength with hip flexion,knee flex/extension, foot dorsi/plantar flexion Cranial nerves 3/4/5/6/11/16/08/11/12 tested and intact Gait normal without ataxia No past pointing Sensation to light touch intact in all extremities EXTREMITIES: pulses normal, full ROM, no deformities noted SKIN: warm, color normal PSYCH: no abnormalities of mood noted  ED Results / Procedures / Treatments   Labs (all labs ordered are listed, but only abnormal results are displayed) Labs Reviewed  COMPREHENSIVE METABOLIC PANEL - Abnormal; Notable for the following components:      Result Value   Potassium 3.4 (*)    Glucose, Bld 347 (*)    All other components within normal limits  RAPID URINE DRUG SCREEN, HOSP PERFORMED - Abnormal; Notable for the following components:   Tetrahydrocannabinol POSITIVE (*)    All other components within normal limits  CBC WITH DIFFERENTIAL/PLATELET  LIPASE, BLOOD    EKG EKG Interpretation  Date/Time:  Monday August 12 2020 03:37:55 EDT Ventricular Rate:  54 PR Interval:  154 QRS Duration: 76 QT Interval:  454 QTC Calculation: 430 R Axis:   37 Text Interpretation: Sinus bradycardia Nonspecific T wave abnormality  Abnormal ECG Confirmed by Zadie Rhine ([id number]) on 08/12/2020 4:01:27 AM   Radiology No results found.  Procedures Procedures   Medications Ordered in ED Medications  ondansetron (ZOFRAN-ODT) disintegrating tablet 8 mg (8 mg Oral Given 08/11/20 1922)    ED Course  I have reviewed the triage vital signs and the nursing notes.  Pertinent labs  results that were available during my care of the patient were reviewed by me and considered in my medical decision making (see chart for details).    MDM Rules/Calculators/A&P                          Patient presents after feeling different after eating a cookie that she thought may be laced with THC She reports she had feeling that her heart stopped and multiple episodes of vomiting.  By the time of my evaluation she was improving.  She had no active pain and was not vomiting.  EKG revealed sinus bradycardia but no AV block or arrhythmia Labs reveal hyperglycemia without anion gap Urine drug  screen was positive for THC.  Patient reports she is not smoked marijuana, therefore this is likely due to what she ingests earlier in the night and likely the culprit of her symptoms. Patient is nearly back to baseline and feels comfortable for discharge home Advised her to take her diabetic medications upon going home due to  Hyperglycemia Final Clinical Impression(s) / ED Diagnoses Final diagnoses:  Accidental ingestion of substance, initial encounter  Hyperglycemia    Rx / DC Orders ED Discharge Orders    None       Zadie Rhine, MD 08/12/20 [phone]

## 2020-08-12 NOTE — ED Notes (Signed)
Pt states she feels funny, denies any sob,n,v or any other complaints

## 2020-08-27 ENCOUNTER — Ambulatory Visit (INDEPENDENT_AMBULATORY_CARE_PROVIDER_SITE_OTHER): Payer: Medicare HMO | Admitting: Podiatry

## 2020-08-27 ENCOUNTER — Other Ambulatory Visit: Payer: Self-pay

## 2020-08-27 DIAGNOSIS — M2010 Hallux valgus (acquired), unspecified foot: Secondary | ICD-10-CM

## 2020-08-27 DIAGNOSIS — L84 Corns and callosities: Secondary | ICD-10-CM

## 2020-08-27 DIAGNOSIS — M722 Plantar fascial fibromatosis: Secondary | ICD-10-CM

## 2020-08-27 DIAGNOSIS — E1142 Type 2 diabetes mellitus with diabetic polyneuropathy: Secondary | ICD-10-CM

## 2020-08-27 DIAGNOSIS — M21619 Bunion of unspecified foot: Secondary | ICD-10-CM

## 2020-08-27 NOTE — Progress Notes (Signed)
The patient presented to the office today to pick up diabetic shoes and 3 pair diabetic custom inserts.  1 pair of inserts were put in the shoes and the shoes were fitted to the patient. The patient states they are comfortable and free of defect. She was satisfied with the fit of the shoe. Instructions for break in and wear were dispensed. The patient signed the delivery documentation and break in instruction form  If any concerns or questions arise, she is instructed to call 

## 2020-12-09 ENCOUNTER — Ambulatory Visit: Payer: Medicare HMO | Admitting: Podiatry

## 2020-12-09 ENCOUNTER — Other Ambulatory Visit: Payer: Self-pay

## 2020-12-09 ENCOUNTER — Encounter: Payer: Self-pay | Admitting: Podiatry

## 2020-12-09 DIAGNOSIS — L84 Corns and callosities: Secondary | ICD-10-CM

## 2020-12-09 DIAGNOSIS — E1142 Type 2 diabetes mellitus with diabetic polyneuropathy: Secondary | ICD-10-CM | POA: Diagnosis not present

## 2020-12-09 NOTE — Patient Instructions (Signed)

## 2020-12-10 NOTE — Progress Notes (Signed)
Subjective: 60 year old female presents the office today for concerns of calluses on both feet on the fifth metatarsal bases bilaterally.  She states that the started her on Friday but over the weekend they were bothering her so she went to trim them down.  She still gets discomfort but she is asking if there is any else can be done.  I did add a lateral wedge to her insert previously which was helpful just asked when she can price meetings.  Asked for other treatment options.  No open sores or reports.  She states that she did purchase some over-the-counter inserts and she had to put the left one in the right and the right one in the left to make it more comfortable to push on the lateral aspect of her foot.  Denies any systemic complaints such as fevers, chills, nausea, vomiting. No acute changes since last appointment, and no other complaints at this time.   A1c she reports was around 8 last time it was checked.  Objective: AAO x3, NAD DP/PT pulses palpable bilaterally, CRT less than 3 seconds Preulcerative area for the metatarsal bases bilaterally.  There was previous callus formation however she is trimmed down on there is new, raw skin present on the left side.  Minimal callus formation still present the right side which is debrided without any complications or bleeding.  No ulcerations.  There is no edema, erythema or signs of infection.  No other areas of discomfort. No open lesions or pre-ulcerative lesions.  No pain with calf compression, swelling, warmth, erythema  Assessment: 60 year old female with fifth metatarsal base preulcerative callus  Plan: -All treatment options discussed with the patient including all alternatives, risks, complications.  -Debrided the right side with any complications or bleeding as there is still some callus formation present.  Recommend moisturizer and offloading daily.  I did dispense lateral wedges that she can apply to her inserts.  Continue offloading,  moisturizing daily.  Recommended not to trim on the calluses given her history of diabetes we will try to avoid any ulcers. -Patient encouraged to call the office with any questions, concerns, change in symptoms.   Ashley Crane DPM

## 2020-12-27 ENCOUNTER — Other Ambulatory Visit: Payer: Self-pay

## 2020-12-27 ENCOUNTER — Emergency Department (HOSPITAL_COMMUNITY): Payer: Medicare HMO

## 2020-12-27 ENCOUNTER — Encounter (HOSPITAL_COMMUNITY): Payer: Self-pay

## 2020-12-27 ENCOUNTER — Emergency Department (HOSPITAL_COMMUNITY)
Admission: EM | Admit: 2020-12-27 | Discharge: 2020-12-27 | Disposition: A | Discharge status: Home / Self Care | Payer: Medicare HMO | Attending: Emergency Medicine | Admitting: Emergency Medicine

## 2020-12-27 DIAGNOSIS — Z7982 Long term (current) use of aspirin: Secondary | ICD-10-CM | POA: Diagnosis not present

## 2020-12-27 DIAGNOSIS — Z7984 Long term (current) use of oral hypoglycemic drugs: Secondary | ICD-10-CM | POA: Insufficient documentation

## 2020-12-27 DIAGNOSIS — Z794 Long term (current) use of insulin: Secondary | ICD-10-CM | POA: Insufficient documentation

## 2020-12-27 DIAGNOSIS — R1011 Right upper quadrant pain: Secondary | ICD-10-CM | POA: Diagnosis present

## 2020-12-27 DIAGNOSIS — E785 Hyperlipidemia, unspecified: Secondary | ICD-10-CM | POA: Insufficient documentation

## 2020-12-27 DIAGNOSIS — Z79899 Other long term (current) drug therapy: Secondary | ICD-10-CM | POA: Insufficient documentation

## 2020-12-27 DIAGNOSIS — R109 Unspecified abdominal pain: Secondary | ICD-10-CM

## 2020-12-27 DIAGNOSIS — E1169 Type 2 diabetes mellitus with other specified complication: Secondary | ICD-10-CM | POA: Diagnosis not present

## 2020-12-27 DIAGNOSIS — J45909 Unspecified asthma, uncomplicated: Secondary | ICD-10-CM | POA: Insufficient documentation

## 2020-12-27 LAB — CBC
HCT: 38 % (ref 36.0–46.0)
Hemoglobin: 12.7 g/dL (ref 12.0–15.0)
MCH: 30.1 pg (ref 26.0–34.0)
MCHC: 33.4 g/dL (ref 30.0–36.0)
MCV: 90 fL (ref 80.0–100.0)
Platelets: 305 10*3/uL (ref 150–400)
RBC: 4.22 MIL/uL (ref 3.87–5.11)
RDW: 12.4 % (ref 11.5–15.5)
WBC: 7 10*3/uL (ref 4.0–10.5)
nRBC: 0 % (ref 0.0–0.2)

## 2020-12-27 LAB — URINALYSIS, ROUTINE W REFLEX MICROSCOPIC
Bilirubin Urine: NEGATIVE
Glucose, UA: 150 mg/dL — AB
Hgb urine dipstick: NEGATIVE
Ketones, ur: NEGATIVE mg/dL
Leukocytes,Ua: NEGATIVE
Nitrite: NEGATIVE
Protein, ur: NEGATIVE mg/dL
Specific Gravity, Urine: 1.01 (ref 1.005–1.030)
pH: 5 (ref 5.0–8.0)

## 2020-12-27 LAB — COMPREHENSIVE METABOLIC PANEL
ALT: 83 U/L — ABNORMAL HIGH (ref 0–44)
AST: 53 U/L — ABNORMAL HIGH (ref 15–41)
Albumin: 4.3 g/dL (ref 3.5–5.0)
Alkaline Phosphatase: 65 U/L (ref 38–126)
Anion gap: 7 (ref 5–15)
BUN: 29 mg/dL — ABNORMAL HIGH (ref 6–20)
CO2: 27 mmol/L (ref 22–32)
Calcium: 10.2 mg/dL (ref 8.9–10.3)
Chloride: 106 mmol/L (ref 98–111)
Creatinine, Ser: 0.84 mg/dL (ref 0.44–1.00)
GFR, Estimated: >60 mL/min (ref >60)
Glucose, Bld: 154 mg/dL — ABNORMAL HIGH (ref 70–99)
Potassium: 4.7 mmol/L (ref 3.5–5.1)
Sodium: 140 mmol/L (ref 135–145)
Total Bilirubin: 1 mg/dL (ref 0.3–1.2)
Total Protein: 8.2 g/dL — ABNORMAL HIGH (ref 6.5–8.1)

## 2020-12-27 LAB — LIPASE, BLOOD: Lipase: 39 U/L (ref 11–51)

## 2020-12-27 MED ORDER — KETOROLAC TROMETHAMINE 15 MG/ML IJ SOLN
15.0000 mg | Freq: Once | INTRAMUSCULAR | Status: AC
  Administered 2020-12-27: 15 mg via INTRAVENOUS
  Filled 2020-12-27: qty 1

## 2020-12-27 MED ORDER — LACTATED RINGERS IV BOLUS
1000.0000 mL | Freq: Once | INTRAVENOUS | Status: AC
  Administered 2020-12-27: 1000 mL via INTRAVENOUS

## 2020-12-27 MED ORDER — CYCLOBENZAPRINE HCL 5 MG PO TABS: 5.0000 mg | ORAL_TABLET | Freq: Three times a day (TID) | ORAL | 0 refills | Status: AC | PRN

## 2020-12-27 NOTE — ED Provider Notes (Signed)
[facility]-EMERGENCY DEPT Provider Note   CSN: [id number] Arrival date & time: 12/27/20  1026     History Chief Complaint  Patient presents with   Flank Pain    Ashley Crane is a 60 y.o. female.   Flank Pain Pertinent negatives include no chest pain, no abdominal pain, no headaches and no shortness of breath. Patient presents for right-sided flank pain.  Onset of pain was yesterday.  She describes the pain as sharp stabbing sensation in her upper right flank.  Pain waxes and wanes in severity.  It is worsened with some movements.  She has not treated her pain today with any analgesia.  She did briefly have some mild nausea.  She denies any vomiting, fevers, chills, dysuria, or hematuria.  She denies any history of nephrolithiasis.  She denies any recent trauma or physical straining.  Medical history is notable for DM, HLD, and HTN.     Past Medical History:  Diagnosis Date   Diabetes mellitus without complication (HCC)    Hyperlipidemia    Hypertension     Patient Active Problem List   Diagnosis Date Noted   Type 2 diabetes mellitus (HCC) 07/18/2020   Allergic rhinitis 05/24/2018   Benign essential hypertension 02/25/2018   Hyperlipidemia 02/25/2018   Insomnia 02/25/2018   Obesity 02/25/2018   Osteoarthritis of knee 02/25/2018   Sciatica 02/25/2018   Shoulder pain 02/25/2018   Vitamin D deficiency 02/25/2018   Asthma 02/24/2017   Constipation 02/24/2017    Past Surgical History:  Procedure Laterality Date   BACK SURGERY  05/11/2005   CHOLECYSTECTOMY       OB History   No obstetric history on file.     Family History  Problem Relation Age of Onset   Diabetes Mother    Hypertension Mother    Heart attack Mother    Cancer Father    Hyperlipidemia Other    Hypertension Other    Diabetes Other    Heart disease Other    Sleep apnea Other     Social History   Tobacco Use   Smoking status: Never   Smokeless tobacco: Never   Vaping Use   Vaping Use: Never used  Substance Use Topics   Alcohol use: No   Drug use: No    Home Medications Prior to Admission medications   Medication Sig Start Date End Date Taking? Authorizing Provider  cyclobenzaprine (FLEXERIL) 5 MG tablet Take 1 tablet (5 mg total) by mouth 3 (three) times daily as needed for up to 3 days for muscle spasms. 12/27/20 12/30/20 Yes Gloris Manchester, MD  aspirin 81 MG chewable tablet Chew 81 mg by mouth daily. 08/21/14   [provider]  atorvastatin (LIPITOR) 40 MG tablet Take 40 mg by mouth daily.    [provider]  Continuous Blood Gluc Sensor (FREESTYLE LIBRE 14 DAY SENSOR) MISC USE AS DIRECTED TO CHECK BLOOD SUGAR 02/26/18   [provider]  diclofenac Sodium (VOLTAREN) 1 % GEL Apply 2 g topically 4 (four) times daily. Rub into affected area of foot 2 to 4 times daily 09/15/19   Vivi Barrack, DPM  Insulin Glargine-Lixisenatide 100-33 UNT-MCG/ML SOPN Inject 60 Units into the skin daily.    [provider]  metFORMIN (GLUCOPHAGE) 1000 MG tablet Take 1,000 mg by mouth 2 (two) times daily. 03/15/18   [provider]  valsartan-hydrochlorothiazide (DIOVAN-HCT) 320-25 MG per tablet Take 1 tablet by mouth daily.    [provider]  Allergies    Oxycodone hcl  Review of Systems   Review of Systems  Constitutional:  Negative for chills and fever.  HENT:  Negative for ear pain and sore throat.   Eyes:  Negative for pain and visual disturbance.  Respiratory:  Negative for cough, chest tightness and shortness of breath.   Cardiovascular:  Negative for chest pain and palpitations.  Gastrointestinal:  Positive for nausea. Negative for abdominal pain, diarrhea and vomiting.  Genitourinary:  Positive for flank pain. Negative for dysuria, hematuria and pelvic pain.  Musculoskeletal:  Negative for arthralgias, back pain, gait problem, myalgias and neck pain.  Skin:  Negative for color change and rash.   Neurological:  Negative for dizziness, seizures, syncope, weakness, light-headedness, numbness and headaches.  Hematological:  Does not bruise/bleed easily.  All other systems reviewed and are negative.  Physical Exam Updated Vital Signs BP (!) 191/81 (BP Location: Left Arm)   Pulse 62   Temp (!) 97.5 F (36.4 C) (Oral)   Resp 17   Ht 5\' 4"  (1.626 m)   Wt 90.7 kg   SpO2 100%   BMI 34.33 kg/m   Physical Exam Vitals and nursing note reviewed.  Constitutional:      General: She is not in acute distress.    Appearance: Normal appearance. She is well-developed and normal weight. She is not ill-appearing, toxic-appearing or diaphoretic.  HENT:     Head: Normocephalic and atraumatic.     Right Ear: External ear normal.     Left Ear: External ear normal.     Nose: Nose normal.     Mouth/Throat:     Mouth: Mucous membranes are moist.     Pharynx: Oropharynx is clear.  Eyes:     Conjunctiva/sclera: Conjunctivae normal.  Cardiovascular:     Rate and Rhythm: Normal rate and regular rhythm.     Heart sounds: No murmur heard. Pulmonary:     Effort: Pulmonary effort is normal. No respiratory distress.     Breath sounds: Normal breath sounds.  Abdominal:     Palpations: Abdomen is soft.     Tenderness: There is no abdominal tenderness. There is no guarding.  Musculoskeletal:        General: Tenderness (Right lower lateral chest wall) present. No swelling or deformity.     Cervical back: Normal range of motion and neck supple.     Right lower leg: No edema.     Left lower leg: No edema.  Skin:    General: Skin is warm and dry.     Coloration: Skin is not jaundiced or pale.  Neurological:     General: No focal deficit present.     Mental Status: She is alert and oriented to person, place, and time.     Cranial Nerves: No cranial nerve deficit.     Sensory: No sensory deficit.     Motor: No weakness.  Psychiatric:        Mood and Affect: Mood normal.        Behavior: Behavior  normal.    ED Results / Procedures / Treatments   Labs (all labs ordered are listed, but only abnormal results are displayed) Labs Reviewed  URINALYSIS, ROUTINE W REFLEX MICROSCOPIC - Abnormal; Notable for the following components:      Result Value   Glucose, UA 150 (*)    All other components within normal limits  COMPREHENSIVE METABOLIC PANEL - Abnormal; Notable for the following components:   Glucose, Bld 154 (*)  BUN 29 (*)    Total Protein 8.2 (*)    AST 53 (*)    ALT 83 (*)    All other components within normal limits  CBC  LIPASE, BLOOD    EKG None  Radiology CT RENAL STONE STUDY  Result Date: 12/27/2020 CLINICAL DATA:  Flank pain, kidney stone suspected EXAM: CT ABDOMEN AND PELVIS WITHOUT CONTRAST TECHNIQUE: Multidetector CT imaging of the abdomen and pelvis was performed following the standard protocol without IV contrast. COMPARISON:  CT abdomen pelvis 07/18/2014 FINDINGS: Lower chest: No acute abnormality. Hepatobiliary: No focal liver abnormality. The gallbladder is surgically absent. Pancreas: Unremarkable. No pancreatic ductal dilatation or surrounding inflammatory changes. Spleen: Normal in size without focal abnormality. Adrenals/Urinary Tract: Adrenal glands are unremarkable. Kidneys are normal, without renal calculi, focal lesion, or hydronephrosis. Bladder is unremarkable. Stomach/Bowel: Stomach is within normal limits. Appendix appears normal. No evidence of bowel wall thickening, distention, or inflammatory changes. Vascular/Lymphatic: Aortic atherosclerosis. No enlarged abdominal or pelvic lymph nodes. Reproductive: Uterus and bilateral adnexa are unremarkable. Other: No abdominal wall hernia or abnormality. No abdominopelvic ascites. Musculoskeletal: No acute or significant osseous findings. IMPRESSION: 1. No acute finding in the abdomen. No evidence of urolithiasis or hydronephrosis. 2.  Aortic Atherosclerosis (ICD10-I70.0). Electronically Signed   By: Wiliam Ke M.D.   On: 12/27/2020 13:34    Procedures Procedures   Medications Ordered in ED Medications  lactated ringers bolus 1,000 mL (0 mLs Intravenous Stopped 12/27/20 1306)  ketorolac (TORADOL) 15 MG/ML injection 15 mg (15 mg Intravenous Given 12/27/20 1158)    ED Course  I have reviewed the triage vital signs and the nursing notes.  Pertinent labs & imaging results that were available during my care of the patient were reviewed by me and considered in my medical decision making (see chart for details).    MDM Rules/Calculators/A&P                           Patient is a 60 year old female who presents for waxing and waning right flank pain.  Pain is located in her right lateral lower chest wall and radiates into the right flank.  Vital signs on arrival notable for hypertension.  Exam is notable for tenderness to deep palpation in this area of pain.  Suspect musculoskeletal etiology, however, will assess labs and CT imaging for kidney stone. IV fluids and Toradol given.  Urine studies did not show any evidence of infection or hematuria.  CT stone study showed no nephrolithiasis or any other findings in the area of her pain.  On reassessment, patient reported some improvement.  She was advised to continue symptomatic relief with ibuprofen.  Prescription for Flexeril given, to be taken as needed.  She was encouraged to return to the ED for any worsening of symptoms.  Patient was discharged in good condition.  Final Clinical Impression(s) / ED Diagnoses Final diagnoses:  Flank pain    Rx / DC Orders ED Discharge Orders          Ordered    cyclobenzaprine (FLEXERIL) 5 MG tablet  3 times daily PRN        12/27/20 1425             Gloris Manchester, MD 12/27/20 1932

## 2020-12-27 NOTE — ED Triage Notes (Signed)
Patient c/o right flank pain since yesterday and worsened today. Patient denies any urinary issues.

## 2021-02-10 ENCOUNTER — Ambulatory Visit (INDEPENDENT_AMBULATORY_CARE_PROVIDER_SITE_OTHER): Payer: Medicare HMO

## 2021-02-10 ENCOUNTER — Encounter: Payer: Self-pay | Admitting: Podiatry

## 2021-02-10 ENCOUNTER — Ambulatory Visit: Payer: Medicare HMO | Admitting: Podiatry

## 2021-02-10 ENCOUNTER — Other Ambulatory Visit: Payer: Self-pay

## 2021-02-10 DIAGNOSIS — M2142 Flat foot [pes planus] (acquired), left foot: Secondary | ICD-10-CM | POA: Diagnosis not present

## 2021-02-10 DIAGNOSIS — M2141 Flat foot [pes planus] (acquired), right foot: Secondary | ICD-10-CM

## 2021-02-10 DIAGNOSIS — S90122A Contusion of left lesser toe(s) without damage to nail, initial encounter: Secondary | ICD-10-CM | POA: Diagnosis not present

## 2021-02-10 DIAGNOSIS — E1142 Type 2 diabetes mellitus with diabetic polyneuropathy: Secondary | ICD-10-CM | POA: Diagnosis not present

## 2021-02-10 DIAGNOSIS — S99922A Unspecified injury of left foot, initial encounter: Secondary | ICD-10-CM

## 2021-02-10 DIAGNOSIS — L84 Corns and callosities: Secondary | ICD-10-CM | POA: Diagnosis not present

## 2021-02-11 NOTE — Progress Notes (Signed)
Subjective: 60 year old female presents the office today for concerns of calluses to both of her feet.  She says she also has flatfeet and feet turning inwards causing discomfort as well.  Interested in diabetic shoes.  Also she has numbness to her toes/feet which seem to be getting worse.  She is diabetic but states that sugar runs around the 130s and the A1c was around 6.7.  Lastly she has concerns of left fifth toe injury.  She states that about 3 weeks ago she hit her left third toe causing discomfort.  She said no recent treatment for this. Denies any systemic complaints such as fevers, chills, nausea, vomiting. No acute changes since last appointment, and no other complaints at this time.   Objective: AAO x3, NAD DP/PT pulses palpable bilaterally, CRT less than 3 seconds Sensation decreased with Semmes Weinstein monofilament Preulcerative hyperkeratotic lesions noted fifth metatarsal base clinical metatarsal head bilaterally.  There is no underlying ulceration drainage or any signs of infection. Pronation present. Tenderness to palpation on the left fifth toe with mild swelling.  There is no open lesion.  The toe is in rectus position. MMT 5/5 No open lesions or pre-ulcerative lesions.  No pain with calf compression, swelling, warmth, erythema  Assessment: 60 year old female with preulcerative hyperkeratotic lesions, pronation deformity; neuropathy which seems to be worsening; left fifth toe injury  Plan: -All treatment options discussed with the patient including all alternatives, risks, complications.  -Regards to left fifth toe injury x-rays were obtained and reviewed.  No evidence of acute fracture present.  Did dispensed surgical shoe given the discomfort with ambulation.  I like her to wear the shoe until symptoms resolve and she can transition to regular shoe. -Sharply debrided hyperkeratotic lesions x4 without any complications or bleeding -Follow-up for new diabetic shoes.  we  will provide better support and offloading. -Given the worsening numbness to her feet, refer to neurology. -Patient encouraged to call the office with any questions, concerns, change in symptoms.   Vivi Barrack DPM

## 2021-02-13 ENCOUNTER — Other Ambulatory Visit: Payer: Medicare HMO

## 2021-02-17 ENCOUNTER — Ambulatory Visit: Payer: Medicare HMO | Admitting: Neurology

## 2021-02-18 ENCOUNTER — Ambulatory Visit: Payer: Medicare HMO | Admitting: Neurology

## 2021-02-18 ENCOUNTER — Encounter: Payer: Self-pay | Admitting: Neurology

## 2021-02-18 ENCOUNTER — Other Ambulatory Visit: Payer: Self-pay

## 2021-02-18 VITALS — BP 183/82 | HR 65 | Ht 64.0 in | Wt 199.0 lb

## 2021-02-18 DIAGNOSIS — E1142 Type 2 diabetes mellitus with diabetic polyneuropathy: Secondary | ICD-10-CM

## 2021-02-18 DIAGNOSIS — G6289 Other specified polyneuropathies: Secondary | ICD-10-CM | POA: Diagnosis not present

## 2021-02-18 MED ORDER — LIDOCAINE 3.5 % EX LOTN: 1.0000 | TOPICAL_LOTION | Freq: Every evening | CUTANEOUS | 0 refills | Status: DC

## 2021-02-18 MED ORDER — PREGABALIN 75 MG PO CAPS: 75.0000 mg | ORAL_CAPSULE | Freq: Two times a day (BID) | ORAL | 0 refills | Status: DC

## 2021-02-18 NOTE — Progress Notes (Signed)
[facility]  PATIENT: Ashley Crane DOB: [birth year]  REFERRING CLINICIAN: Willey Blade, MD HISTORY FROM: Patient  REASON FOR VISIT: Bilateral lower extremities pain   HISTORICAL  CHIEF COMPLAINT:  Chief Complaint  Patient presents with   New Patient (Initial Visit)    Rm 12, alone, c/o numbess and pain in both feet, painful while walking     HISTORY OF PRESENT ILLNESS:  This is a 60 year old woman with past medical history of diabetes mellitus, back pain status post surgery who is presenting with bilateral feet numbness and pain.  Patient stated he has been having pain and numbness for the past 3 years, diagnosed with peripheral neuropathy.  She has been tried on gabapentin and tramadol without much relief.  Currently the pain is on top of the foot she also have toes numbness.  Pain is described as stabbing burning pain.  For her diabetes she is on metformin and on insulin.  Her last A1c was 8.6. On top of her neuropathy, she also have previous foot surgery (tendon release surgery). She had a history for back pain status post back surgery and states that this pain is different from the pain that she had with her back pain.     OTHER MEDICAL CONDITIONS: Diabetes Mellitus, Back pain s/p surgery    REVIEW OF SYSTEMS: Full 14 system review of systems performed and negative with exception of: as noted in the HPI  ALLERGIES: Allergies  Allergen Reactions   Oxycodone Hcl     Unknown sick feeling    HOME MEDICATIONS: Outpatient Medications Prior to Visit  Medication Sig Dispense Refill   aspirin 81 MG chewable tablet Chew 81 mg by mouth daily.     atorvastatin (LIPITOR) 40 MG tablet Take 40 mg by mouth daily.     Continuous Blood Gluc Sensor (FREESTYLE LIBRE 14 DAY SENSOR) MISC USE AS DIRECTED TO CHECK BLOOD SUGAR  5   cyclobenzaprine (FLEXERIL) 5 MG tablet cyclobenzaprine 5 mg tablet  TAKE 1 TABLET BY MOUTH THREE TIMES DAILY AS NEEDED FOR MUSCLE  SPASM FOR UP TO 3 DAYS     diclofenac Sodium (VOLTAREN) 1 % GEL Apply 2 g topically 4 (four) times daily. Rub into affected area of foot 2 to 4 times daily 100 g 2   Insulin Glargine-Lixisenatide 100-33 UNT-MCG/ML SOPN Inject 60 Units into the skin daily.     metFORMIN (GLUCOPHAGE) 1000 MG tablet Take 1,000 mg by mouth 2 (two) times daily.     omeprazole (PRILOSEC) 40 MG capsule omeprazole 40 mg capsule,delayed release  TAKE ONE CAPSULE BY MOUTH 20 MINUTES BEFORE BREAKFAST FOR 90 DAYS     PAXLOVID, 300/100, 20 x 150 MG & 10 x 100MG TBPK Take by mouth.     valsartan-hydrochlorothiazide (DIOVAN-HCT) 320-25 MG per tablet Take 1 tablet by mouth daily.     No facility-administered medications prior to visit.    PAST MEDICAL HISTORY: Past Medical History:  Diagnosis Date   Diabetes mellitus without complication ([place])    Hyperlipidemia    Hypertension     PAST SURGICAL HISTORY: Past Surgical History:  Procedure Laterality Date   BACK SURGERY  05/11/2005   CHOLECYSTECTOMY      FAMILY HISTORY: Family History  Problem Relation Age of Onset   Diabetes Mother    Hypertension Mother    Heart attack Mother    Cancer Father    Hyperlipidemia Other    Hypertension Other    Diabetes Other    Heart  disease Other    Sleep apnea Other     SOCIAL HISTORY: Social History   Socioeconomic History   Marital status: Single    Spouse name: Not on file   Number of children: Not on file   Years of education: Not on file   Highest education level: Not on file  Occupational History   Not on file  Tobacco Use   Smoking status: Never   Smokeless tobacco: Never  Vaping Use   Vaping Use: Never used  Substance and Sexual Activity   Alcohol use: No   Drug use: No   Sexual activity: Not on file  Other Topics Concern   Not on file  Social History Narrative   Not on file   Social Determinants of Health   Financial Resource Strain: Not on file  Food Insecurity: Not on file   Transportation Needs: Not on file  Physical Activity: Not on file  Stress: Not on file  Social Connections: Not on file  Intimate Partner Violence: Not on file     PHYSICAL EXAM  GENERAL EXAM/CONSTITUTIONAL: Vitals:  Vitals:   02/18/21 1401  BP: (!) 183/82  Pulse: 65  Weight: 199 lb (90.3 kg)  Height: '5\' 4"'  (1.626 m)   Body mass index is 34.16 kg/m. Wt Readings from Last 3 Encounters:  02/18/21 199 lb (90.3 kg)  12/27/20 200 lb (90.7 kg)  02/25/16 192 lb (87.1 kg)   Patient is in no distress; well developed, nourished and groomed; neck is supple  EYES: Pupils round and reactive to light, Visual fields full to confrontation, Extraocular movements intacts,   MUSCULOSKELETAL: Gait, strength, tone, movements noted in Neurologic exam below  NEUROLOGIC: MENTAL STATUS:  No flowsheet data found. awake, alert, oriented to person, place and time recent and remote memory intact normal attention and concentration language fluent, comprehension intact, naming intact fund of knowledge appropriate  CRANIAL NERVE:  2nd, 3rd, 4th, 6th - pupils equal and reactive to light, visual fields full to confrontation, extraocular muscles intact, no nystagmus 5th - facial sensation symmetric 7th - facial strength symmetric 8th - hearing intact 9th - palate elevates symmetrically, uvula midline 11th - shoulder shrug symmetric 12th - tongue protrusion midline  MOTOR:  normal bulk and tone, full strength in the BUE, BLE  SENSORY:  normal and symmetric to light touch, pinprick, temperature, vibration  COORDINATION:  finger-nose-finger, fine finger movements normal  REFLEXES:  deep tendon reflexes present and symmetric  GAIT/STATION:  normal   DIAGNOSTIC DATA (LABS, IMAGING, TESTING) - I reviewed patient records, labs, notes, testing and imaging myself where available.  Lab Results  Component Value Date   WBC 7.0 12/27/2020   HGB 12.7 12/27/2020   HCT 38.0 12/27/2020    MCV 90.0 12/27/2020   PLT 305 12/27/2020      Component Value Date/Time   NA 140 12/27/2020 1135   K 4.7 12/27/2020 1135   CL 106 12/27/2020 1135   CO2 27 12/27/2020 1135   GLUCOSE 154 (H) 12/27/2020 1135   BUN 29 (H) 12/27/2020 1135   CREATININE 0.84 12/27/2020 1135   CALCIUM 10.2 12/27/2020 1135   PROT 8.2 (H) 12/27/2020 1135   ALBUMIN 4.3 12/27/2020 1135   AST 53 (H) 12/27/2020 1135   ALT 83 (H) 12/27/2020 1135   ALKPHOS 65 12/27/2020 1135   BILITOT 1.0 12/27/2020 1135   GFRNONAA >60 12/27/2020 1135   GFRAA  08/04/2008 0850    >60        The  eGFR has been calculated using the MDRD equation. This calculation has not been validated in all clinical situations. eGFR's persistently <60 mL/min signify possible Chronic Kidney Disease.   No results found for: CHOL, HDL, LDLCALC, LDLDIRECT, TRIG, CHOLHDL No results found for: HGBA1C No results found for: VITAMINB12 No results found for: TSH    ASSESSMENT AND PLAN  60 y.o. year old female with diabetes mellitus type 2 on insulin and metformin, low back pain status post surgery and peripheral neuropathy who is presenting with pain and numbness on both feet and toes.  Patient has tried gabapentin without much relief.  On exam today she has normal sensation to light touch, pinprick and vibration.  She reported she is doing better in terms of her diabetes with her diet and insulin, she feels like her sugar is better controlled.  Her current worsening pain might be secondary to insulin neuritis  but I will order the peripheral neuropathy labs to investigate for other causes other than diabetes. I will also order a NCS/EMG test and start the patient on Pregabalin and lidocaine topical cream. I will follow up with her in 3 months.   1. Other polyneuropathy   2. Diabetic peripheral neuropathy (HCC)     PLAN: Stop Gabapentin  Start Pregabalin 75 mg twice a day  Can use lidocaine cream as needed  NCS/EMG  Follow up in 3 months    Orders Placed This Encounter  Procedures   CBC with Differential/Platelets   CMP   Sedimentation Rate   ANA   TSH   B12 and Folate Panel   Immunofixation Electrophoresis, Serum   Hemoglobin A1c   NCV with EMG(electromyography)    Meds ordered this encounter  Medications   pregabalin (LYRICA) 75 MG capsule    Sig: Take 1 capsule (75 mg total) by mouth 2 (two) times daily.    Dispense:  60 capsule    Refill:  0   Lidocaine 3.5 % LOTN    Sig: Apply 1 Bottle topically at bedtime.    Dispense:  120 g    Refill:  0    Return in about 3 months (around 05/21/2021).    Alric Ran, MD 02/18/2021, 4:07 PM  [facility] [address], [place], [place] [address] [phone]

## 2021-02-18 NOTE — Patient Instructions (Signed)
Stop Gabapentin  Start Pregabalin 75 mg twice a day  Can use lidocaine cream as needed  NCS/EMG  Follow up in 3 months

## 2021-02-24 ENCOUNTER — Other Ambulatory Visit: Payer: Self-pay | Admitting: Neurology

## 2021-02-24 ENCOUNTER — Telehealth: Payer: Self-pay | Admitting: Neurology

## 2021-02-24 MED ORDER — LIDOCAINE 3.5 % EX LOTN: 1.0000 | TOPICAL_LOTION | CUTANEOUS | 0 refills | Status: DC | PRN

## 2021-02-24 NOTE — Telephone Encounter (Signed)
Contacted patient to go over the lab results, her hemoglobin A1c was 8.7, B12 is normal, TSH is normal, CBC is normal, ANA is normal, sed rate slightly elevated at 44 and serum electrophoresis was also normal.  Explained to the patient that her neuropathy is likely from her diabetes.  Patient stated that her lidocaine cream was not available at her [facility] pharmacy and requested a new prescription be sent to the [facility] [address] [facility].  [facility] to [facility].  All questions answered.  Windell Norfolk, MD

## 2021-02-25 LAB — B12 AND FOLATE PANEL
Folate: >20 ng/mL (ref >3.0)
Vitamin B-12: 1024 pg/mL (ref 232–1245)

## 2021-02-25 LAB — CBC WITH DIFFERENTIAL/PLATELET
Basophils Absolute: 0 10*3/uL (ref 0.0–0.2)
Basos: 0 %
EOS (ABSOLUTE): 0.1 10*3/uL (ref 0.0–0.4)
Eos: 1 %
Hematocrit: 38.2 % (ref 34.0–46.6)
Hemoglobin: 13 g/dL (ref 11.1–15.9)
Immature Grans (Abs): 0 10*3/uL (ref 0.0–0.1)
Immature Granulocytes: 0 %
Lymphocytes Absolute: 2.6 10*3/uL (ref 0.7–3.1)
Lymphs: 31 %
MCH: 29.5 pg (ref 26.6–33.0)
MCHC: 34 g/dL (ref 31.5–35.7)
MCV: 87 fL (ref 79–97)
Monocytes Absolute: 0.5 10*3/uL (ref 0.1–0.9)
Monocytes: 6 %
Neutrophils Absolute: 5.3 10*3/uL (ref 1.4–7.0)
Neutrophils: 62 %
Platelets: 346 10*3/uL (ref 150–450)
RBC: 4.4 x10E6/uL (ref 3.77–5.28)
RDW: 12.3 % (ref 11.7–15.4)
WBC: 8.6 10*3/uL (ref 3.4–10.8)

## 2021-02-25 LAB — COMPREHENSIVE METABOLIC PANEL
ALT: 35 IU/L — ABNORMAL HIGH (ref 0–32)
AST: 30 IU/L (ref 0–40)
Albumin/Globulin Ratio: 1.7 (ref 1.2–2.2)
Albumin: 4.8 g/dL (ref 3.8–4.9)
Alkaline Phosphatase: 101 IU/L (ref 44–121)
BUN/Creatinine Ratio: 25 (ref 12–28)
BUN: 27 mg/dL (ref 8–27)
Bilirubin Total: 0.2 mg/dL (ref 0.0–1.2)
CO2: 22 mmol/L (ref 20–29)
Calcium: 10.1 mg/dL (ref 8.7–10.3)
Chloride: 101 mmol/L (ref 96–106)
Creatinine, Ser: 1.08 mg/dL — ABNORMAL HIGH (ref 0.57–1.00)
Globulin, Total: 2.8 g/dL (ref 1.5–4.5)
Glucose: 176 mg/dL — ABNORMAL HIGH (ref 70–99)
Potassium: 4.4 mmol/L (ref 3.5–5.2)
Sodium: 138 mmol/L (ref 134–144)
eGFR: 59 mL/min/{1.73_m2} — ABNORMAL LOW (ref >59)

## 2021-02-25 LAB — HEMOGLOBIN A1C
Est. average glucose Bld gHb Est-mCnc: 203 mg/dL
Hgb A1c MFr Bld: 8.7 % — ABNORMAL HIGH (ref 4.8–5.6)

## 2021-02-25 LAB — ANA: ANA Titer 1: NEGATIVE

## 2021-02-25 LAB — TSH: TSH: 3.04 u[IU]/mL (ref 0.450–4.500)

## 2021-02-25 LAB — IMMUNOFIXATION ELECTROPHORESIS
IgA/Immunoglobulin A, Serum: 184 mg/dL (ref 87–352)
IgG (Immunoglobin G), Serum: 1254 mg/dL (ref 586–1602)
IgM (Immunoglobulin M), Srm: 102 mg/dL (ref 26–217)
Total Protein: 7.6 g/dL (ref 6.0–8.5)

## 2021-02-25 LAB — SEDIMENTATION RATE: Sed Rate: 44 mm/hr — ABNORMAL HIGH (ref 0–40)

## 2021-03-19 ENCOUNTER — Encounter: Payer: Medicare HMO | Admitting: Neurology

## 2021-03-19 ENCOUNTER — Ambulatory Visit (INDEPENDENT_AMBULATORY_CARE_PROVIDER_SITE_OTHER): Payer: Medicare HMO | Admitting: Neurology

## 2021-03-19 DIAGNOSIS — E1142 Type 2 diabetes mellitus with diabetic polyneuropathy: Secondary | ICD-10-CM

## 2021-03-19 DIAGNOSIS — G6289 Other specified polyneuropathies: Secondary | ICD-10-CM

## 2021-03-19 DIAGNOSIS — R202 Paresthesia of skin: Secondary | ICD-10-CM | POA: Diagnosis not present

## 2021-03-19 DIAGNOSIS — Z0289 Encounter for other administrative examinations: Secondary | ICD-10-CM

## 2021-03-19 NOTE — Procedures (Signed)
Full Name: Ashley Crane Gender: Female MRN #: [id number] Date of Birth: December 26, 1960    Visit Date: 03/19/2021 10:22 Age: 60 Years Examining Physician: Levert Feinstein, MD  Referring Physician: Windell Norfolk, MD Height: 5 feet 4 inch Patient History: 199lbs History: 60 year old female, with history of diabetes, presenting with 2 years history of bilateral toes paresthesia, worsening low back pain, radiating pain to left posterior thigh  Summary of the test:  Nerve conduction study: Bilateral sural, superficial peroneal sensory responses were normal.  Bilateral tibial, peroneal to EDB motor responses were normal.  Electromyography: Selected needle examination of bilateral lower extremity muscles and bilateral lumbosacral paraspinal muscles were normal.  Conclusion: This is a normal study.  There is no electrodiagnostic evidence of large fiber peripheral neuropathy or bilateral lumbosacral radiculopathy, but above test could not rule out a possibility of small fiber neuropathy.    ------------------------------- Levert Feinstein, M.D. PhD  [facility] [address], [address] [place], [place] [address] Tel: [phone] Fax: [phone]  Verbal informed consent was obtained from the patient, patient was informed of potential risk of procedure, including bruising, bleeding, hematoma formation, infection, muscle weakness, muscle pain, numbness, among others.        MNC    Nerve / Sites Muscle Latency Ref. Amplitude Ref. Rel Amp Segments Distance Velocity Ref. Area    ms ms mV mV %  cm m/s m/s mVms  L Peroneal - EDB     Ankle EDB 3.7 ?6.5 6.4 ?2.0 100 Ankle - EDB 9   21.8     Fib head EDB 10.3  5.1  79.5 Fib head - Ankle 32 48 ?44 19.9     Pop fossa EDB 12.6  5.2  103 Pop fossa - Fib head 10 44 ?44 20.8         Pop fossa - Ankle      R Peroneal - EDB     Ankle EDB 4.0 ?6.5 6.9 ?2.0 100 Ankle - EDB 9   23.0     Fib head EDB 10.4  7.0  102 Fib head - Ankle 32 50  ?44 24.6     Pop fossa EDB 12.6  6.6  94.3 Pop fossa - Fib head 10 44 ?44 25.1         Pop fossa - Ankle      L Tibial - AH     Ankle AH 4.5 ?5.8 7.3 ?4.0 100 Ankle - AH 9   15.4     Pop fossa AH 14.1  5.1  70 Pop fossa - Ankle 39 41 ?41 13.0  R Tibial - AH     Ankle AH 4.2 ?5.8 8.2 ?4.0 100 Ankle - AH 9   18.0     Pop fossa AH 13.7  5.7  69.3 Pop fossa - Ankle 39 41 ?41 18.7             SNC    Nerve / Sites Rec. Site Peak Lat Ref.  Amp Ref. Segments Distance    ms ms V V  cm  L Sural - Ankle (Calf)     Calf Ankle 3.7 ?4.4 7 ?6 Calf - Ankle 14  R Sural - Ankle (Calf)     Calf Ankle 3.2 ?4.4 7 ?6 Calf - Ankle 14  L Superficial peroneal - Ankle     Lat leg Ankle 3.8 ?4.4 6 ?6 Lat leg - Ankle 14  R Superficial peroneal - Ankle  Lat leg Ankle 3.6 ?4.4 6 ?6 Lat leg - Ankle 14             F  Wave    Nerve F Lat Ref.   ms ms  L Tibial - AH 55.4 ?56.0  R Tibial - AH 53.8 ?56.0         EMG Summary Table    Spontaneous MUAP Recruitment  Muscle IA Fib PSW Fasc Other Amp Dur. Poly Pattern  R. Tibialis anterior Normal None None None _______ Normal Normal Normal Normal  R. Tibialis posterior Normal None None None _______ Normal Normal Normal Normal  R. Peroneus longus Normal None None None _______ Normal Normal Normal Normal  R. Vastus lateralis Normal None None None _______ Normal Normal Normal Normal  L. Tibialis anterior Normal None None None _______ Normal Normal Normal Normal  L. Tibialis posterior Normal None None None _______ Normal Normal Normal Normal  L. Vastus lateralis Normal None None None _______ Normal Normal Normal Normal  R. Gastrocnemius (Medial head) Normal None None None _______ Normal Normal Normal Normal  L. Gastrocnemius (Medial head) Normal None None None _______ Normal Normal Normal Normal  L. Lumbar paraspinals (mid) Normal None None None _______ Normal Normal Normal Normal  L. Lumbar paraspinals (low) Normal None None None _______ Normal Normal Normal  Normal  R. Lumbar paraspinals (low) Normal None None None _______ Normal Normal Normal Normal  R. Lumbar paraspinals (mid) Normal None None None _______ Normal Normal Normal Normal

## 2021-03-24 ENCOUNTER — Ambulatory Visit: Payer: Medicare HMO | Admitting: Podiatry

## 2021-03-24 ENCOUNTER — Other Ambulatory Visit: Payer: Self-pay

## 2021-03-24 DIAGNOSIS — M2142 Flat foot [pes planus] (acquired), left foot: Secondary | ICD-10-CM

## 2021-03-24 DIAGNOSIS — E1142 Type 2 diabetes mellitus with diabetic polyneuropathy: Secondary | ICD-10-CM | POA: Diagnosis not present

## 2021-03-24 DIAGNOSIS — M2141 Flat foot [pes planus] (acquired), right foot: Secondary | ICD-10-CM

## 2021-03-24 DIAGNOSIS — L84 Corns and callosities: Secondary | ICD-10-CM | POA: Diagnosis not present

## 2021-03-26 NOTE — Progress Notes (Signed)
Subjective: 60 year old female presents to the office today for concerns of calluses to both of her feet.  She still having pain to her foot.  She does continue her inserts which help but she has to put the opposite foot insert inside her shoe to make it feel she is getting pressure off of her foot.  No recent injury.  No increase in swelling.  No open sores or drainage or any swelling.  Objective: AAO x3, NAD DP/PT pulses palpable bilaterally, CRT less than 3 seconds Sensation decreased with Semmes Weinstein monofilament Preulcerative hyperkeratotic lesions noted to bilateral fifth metatarsal bases as well as the fifth metatarsal head.  There is no underlying ulceration drainage or signs of infection.  No open lesions identified today.  No other areas of discomfort identified today.  MMT 5/5. No significant pain to the 5th toes.  No pain with calf compression, swelling, warmth, erythema  Assessment: Preulcerative lesions, type 2 diabetes with neuropathy  Plan: -All treatment options discussed with the patient including all alternatives, risks, complications.  -Sharply debrided lesions to consider any complications or bleeding.  Recommend moisturizer and offloading daily.  We will have her follow-up for diabetic shoes, inserts and this will help take further pressure off of the areas. -Daily foot inspection. -Patient encouraged to call the office with any questions, concerns, change in symptoms.   Vivi Barrack DPM

## 2021-05-26 ENCOUNTER — Ambulatory Visit: Payer: Medicare HMO | Admitting: Podiatry

## 2021-05-26 ENCOUNTER — Other Ambulatory Visit: Payer: Self-pay

## 2021-05-26 DIAGNOSIS — M21619 Bunion of unspecified foot: Secondary | ICD-10-CM

## 2021-05-26 DIAGNOSIS — M2010 Hallux valgus (acquired), unspecified foot: Secondary | ICD-10-CM

## 2021-05-26 DIAGNOSIS — M2141 Flat foot [pes planus] (acquired), right foot: Secondary | ICD-10-CM

## 2021-05-26 DIAGNOSIS — L84 Corns and callosities: Secondary | ICD-10-CM

## 2021-05-26 DIAGNOSIS — M2142 Flat foot [pes planus] (acquired), left foot: Secondary | ICD-10-CM

## 2021-05-26 DIAGNOSIS — E1142 Type 2 diabetes mellitus with diabetic polyneuropathy: Secondary | ICD-10-CM | POA: Diagnosis not present

## 2021-05-27 ENCOUNTER — Ambulatory Visit: Payer: Medicare HMO | Admitting: Neurology

## 2021-05-27 ENCOUNTER — Encounter: Payer: Self-pay | Admitting: Neurology

## 2021-05-27 VITALS — BP 140/71 | HR 58 | Ht 64.0 in | Wt 199.0 lb

## 2021-05-27 DIAGNOSIS — E1142 Type 2 diabetes mellitus with diabetic polyneuropathy: Secondary | ICD-10-CM

## 2021-05-27 NOTE — Progress Notes (Signed)
[facility]  PATIENT: Ashley Crane DOB: [birth year]  REFERRING CLINICIAN: Willey Blade, MD HISTORY FROM: Patient  REASON FOR VISIT: Bilateral lower extremities pain follow up   HISTORICAL  CHIEF COMPLAINT:  Chief Complaint  Patient presents with   Follow-up    Rm 12, alone, pt states  Pt states she is doing well, she is seeing pain management and they have helped her neuropathy pain     INTERVAL HISTORY 05/27/2021:  Patient presents today for follow-up, last visit was on October 11, at that time plan was to obtain a EMG nerve conduction study and to start the patient on pregabalin.  She reported starting the pregabalin for a month. initially it was helpful but later was not as helpful as before.  She did not call the office to update me.  Her nerve conduction study was done and it was normal, there was no evidence of large fiber neuropathy.  Patient said that she follow-up with a chronic pain management, they have been working with her only with diet and exercise, no pain medicine and no other medication.  She has been working on a diet and doing physical therapy and she report good improvement.  She said that her pain is much better.  She is working on losing weight and now her diabetes is also better controlled.     HISTORY OF PRESENT ILLNESS:  This is a 61 year old woman with past medical history of diabetes mellitus, back pain status post surgery who is presenting with bilateral feet numbness and pain.  Patient stated he has been having pain and numbness for the past 3 years, diagnosed with peripheral neuropathy.  She has been tried on gabapentin and tramadol without much relief.  Currently the pain is on top of the foot she also have toes numbness.  Pain is described as stabbing burning pain.  For her diabetes she is on metformin and on insulin.  Her last A1c was 8.6. On top of her neuropathy, she also have previous foot surgery (tendon release  surgery). She had a history for back pain status post back surgery and states that this pain is different from the pain that she had with her back pain.     OTHER MEDICAL CONDITIONS: Diabetes Mellitus, Back pain s/p surgery    REVIEW OF SYSTEMS: Full 14 system review of systems performed and negative with exception of: as noted in the HPI  ALLERGIES: Allergies  Allergen Reactions   Oxycodone Hcl     Unknown sick feeling    HOME MEDICATIONS: Outpatient Medications Prior to Visit  Medication Sig Dispense Refill   aspirin 81 MG chewable tablet Chew 81 mg by mouth daily.     atorvastatin (LIPITOR) 40 MG tablet Take 40 mg by mouth daily.     Continuous Blood Gluc Sensor (FREESTYLE LIBRE 14 DAY SENSOR) MISC USE AS DIRECTED TO CHECK BLOOD SUGAR  5   cyclobenzaprine (FLEXERIL) 5 MG tablet cyclobenzaprine 5 mg tablet  TAKE 1 TABLET BY MOUTH THREE TIMES DAILY AS NEEDED FOR MUSCLE SPASM FOR UP TO 3 DAYS     diclofenac Sodium (VOLTAREN) 1 % GEL Apply 2 g topically 4 (four) times daily. Rub into affected area of foot 2 to 4 times daily 100 g 2   Insulin Glargine-Lixisenatide 100-33 UNT-MCG/ML SOPN Inject 60 Units into the skin daily.     Lidocaine 3.5 % LOTN Apply 1 Bottle topically as needed. 120 g 0   metFORMIN (GLUCOPHAGE) 1000 MG tablet Take  1,000 mg by mouth 2 (two) times daily.     omeprazole (PRILOSEC) 40 MG capsule omeprazole 40 mg capsule,delayed release  TAKE ONE CAPSULE BY MOUTH 20 MINUTES BEFORE BREAKFAST FOR 90 DAYS     PAXLOVID, 300/100, 20 x 150 MG & 10 x 100MG TBPK Take by mouth.     valsartan-hydrochlorothiazide (DIOVAN-HCT) 320-25 MG per tablet Take 1 tablet by mouth daily.     pregabalin (LYRICA) 75 MG capsule Take 1 capsule (75 mg total) by mouth 2 (two) times daily. 60 capsule 0   No facility-administered medications prior to visit.    PAST MEDICAL HISTORY: Past Medical History:  Diagnosis Date   Diabetes mellitus without complication ([place])    Hyperlipidemia     Hypertension     PAST SURGICAL HISTORY: Past Surgical History:  Procedure Laterality Date   BACK SURGERY  05/11/2005   CHOLECYSTECTOMY      FAMILY HISTORY: Family History  Problem Relation Age of Onset   Diabetes Mother    Hypertension Mother    Heart attack Mother    Cancer Father    Hyperlipidemia Other    Hypertension Other    Diabetes Other    Heart disease Other    Sleep apnea Other     SOCIAL HISTORY: Social History   Socioeconomic History   Marital status: Single    Spouse name: Not on file   Number of children: Not on file   Years of education: Not on file   Highest education level: Not on file  Occupational History   Not on file  Tobacco Use   Smoking status: Never   Smokeless tobacco: Never  Vaping Use   Vaping Use: Never used  Substance and Sexual Activity   Alcohol use: No   Drug use: No   Sexual activity: Not on file  Other Topics Concern   Not on file  Social History Narrative   Not on file   Social Determinants of Health   Financial Resource Strain: Not on file  Food Insecurity: Not on file  Transportation Needs: Not on file  Physical Activity: Not on file  Stress: Not on file  Social Connections: Not on file  Intimate Partner Violence: Not on file     PHYSICAL EXAM  GENERAL EXAM/CONSTITUTIONAL: Vitals:  Vitals:   05/27/21 0959  BP: 140/71  Pulse: (!) 58  Weight: 199 lb (90.3 kg)  Height: '5\' 4"'  (1.626 m)   Body mass index is 34.16 kg/m. Wt Readings from Last 3 Encounters:  05/27/21 199 lb (90.3 kg)  02/18/21 199 lb (90.3 kg)  12/27/20 200 lb (90.7 kg)   Patient is in no distress; well developed, nourished and groomed; neck is supple  EYES: Pupils round and reactive to light, Visual fields full to confrontation, Extraocular movements intacts,   MUSCULOSKELETAL: Gait, strength, tone, movements noted in Neurologic exam below  NEUROLOGIC: MENTAL STATUS:  No flowsheet data found. awake, alert, oriented to person,  place and time recent and remote memory intact normal attention and concentration language fluent, comprehension intact, naming intact fund of knowledge appropriate  CRANIAL NERVE:  2nd, 3rd, 4th, 6th - pupils equal and reactive to light, visual fields full to confrontation, extraocular muscles intact, no nystagmus 5th - facial sensation symmetric 7th - facial strength symmetric 8th - hearing intact 9th - palate elevates symmetrically, uvula midline 11th - shoulder shrug symmetric 12th - tongue protrusion midline  MOTOR:  normal bulk and tone, full strength in the BUE, BLE  SENSORY:  normal and symmetric to light touch, pinprick, temperature, vibration  COORDINATION:  finger-nose-finger, fine finger movements normal  REFLEXES:  deep tendon reflexes present and symmetric  GAIT/STATION:  normal   DIAGNOSTIC DATA (LABS, IMAGING, TESTING) - I reviewed patient records, labs, notes, testing and imaging myself where available.  Lab Results  Component Value Date   WBC 8.6 02/18/2021   HGB 13.0 02/18/2021   HCT 38.2 02/18/2021   MCV 87 02/18/2021   PLT 346 02/18/2021      Component Value Date/Time   NA 138 02/18/2021 1442   K 4.4 02/18/2021 1442   CL 101 02/18/2021 1442   CO2 22 02/18/2021 1442   GLUCOSE 176 (H) 02/18/2021 1442   GLUCOSE 154 (H) 12/27/2020 1135   BUN 27 02/18/2021 1442   CREATININE 1.08 (H) 02/18/2021 1442   CALCIUM 10.1 02/18/2021 1442   PROT 7.6 02/18/2021 1442   ALBUMIN 4.8 02/18/2021 1442   AST 30 02/18/2021 1442   ALT 35 (H) 02/18/2021 1442   ALKPHOS 101 02/18/2021 1442   BILITOT 0.2 02/18/2021 1442   GFRNONAA >60 12/27/2020 1135   GFRAA  08/04/2008 0850    >60        The eGFR has been calculated using the MDRD equation. This calculation has not been validated in all clinical situations. eGFR's persistently <60 mL/min signify possible Chronic Kidney Disease.   No results found for: CHOL, HDL, LDLCALC, LDLDIRECT, TRIG, CHOLHDL Lab  Results  Component Value Date   HGBA1C 8.7 (H) 02/18/2021   Lab Results  Component Value Date   VITAMINB12 1,024 02/18/2021   Lab Results  Component Value Date   TSH 3.040 02/18/2021    EMG/NCS 03/2021 This is a normal study.  There is no electrodiagnostic evidence of large fiber peripheral neuropathy or bilateral lumbosacral radiculopathy, but above test could not rule out a possibility of small fiber neuropathy.   ASSESSMENT AND PLAN  61 y.o. year old female with diabetes mellitus type 2 on insulin and metformin, low back pain status post surgery and peripheral neuropathy who is presenting for follow up of her peripheral neuropathy.  Since last visit she has tried pregabalin which was initially helpful but no longer, she completed the EMG nerve conduction study which was normal, did not show any large fiber neuropathy.  Currently she is working with a chronic pain management and they are managing her neuropathy with diet and exercise.  Patient reported good improvement, she is not taking any medication specifically for her neuropathy but said with with the diet she has lost a lot of weight and working on her diabetes.  She is currently satisfied with management there.  I have advised patient to continue with her current management working towards losing weight and controlling her diabetes.  I have encouraged her and advised her to follow-up with her primary care doctor and return if worse.   1. Diabetic peripheral neuropathy [facility])     Patient Instructions  Continue current medications  Follow up with your PCP  Return as needed or sooner if worse    No orders of the defined types were placed in this encounter.   No orders of the defined types were placed in this encounter.   Return if symptoms worsen or fail to improve.    Alric Ran, MD 05/27/2021, 4:23 PM  [facility] [address] [place], [place] [address] [phone]

## 2021-05-27 NOTE — Patient Instructions (Signed)
Continue current medications  °Follow up with your PCP  °Return as needed or sooner if worse  °

## 2021-05-28 NOTE — Progress Notes (Signed)
Subjective: 61 year old female presents to the office today for concerns of calluses to both of her feet.  She states that been hurting quite a bit recently.  Does feel better after treatment.  No swelling redness or any drainage or any open sores.  She also has neuropathy.  She has been seen chronic pain specialist at  [facility].   Objective: AAO x3, NAD DP/PT pulses palpable bilaterally, CRT less than 3 seconds Sensation decreased with Semmes Weinstein monofilament Preulcerative hyperkeratotic lesions noted to bilateral fifth metatarsal bases as well as the fifth metatarsal head.  Left side is worse than right.  There is no underlying ulceration drainage or signs of infection.  No open lesions identified today.  No other areas of discomfort identified today.  MMT 5/5.  Decreased medial arch height.  Bunions present. No pain with calf compression, swelling, warmth, erythema  Assessment: Preulcerative lesions, type 2 diabetes with neuropathy  Plan: -All treatment options discussed with the patient including all alternatives, risks, complications.  -Sharply debrided lesions x4 without any complications or bleeding.  Recommend moisturizer and offloading daily.   -To be seen by Guadlupe Spanish, orthotist for diabetic shoes. -Daily foot inspection. -Patient encouraged to call the office with any questions, concerns, change in symptoms.   Trula Slade DPM

## 2021-07-11 ENCOUNTER — Telehealth: Payer: Self-pay

## 2021-07-11 NOTE — Telephone Encounter (Signed)
Shoes Ordered - [id number] ?

## 2021-07-14 ENCOUNTER — Telehealth: Payer: Self-pay

## 2021-07-14 NOTE — Telephone Encounter (Signed)
Casts sent to [employer] ?

## 2021-08-01 ENCOUNTER — Telehealth: Payer: Self-pay

## 2021-08-01 NOTE — Telephone Encounter (Signed)
Lvm for pt to call back to schedule shoe pick up. Also sent mychart message

## 2021-08-13 ENCOUNTER — Ambulatory Visit (INDEPENDENT_AMBULATORY_CARE_PROVIDER_SITE_OTHER): Payer: Medicare HMO

## 2021-08-13 DIAGNOSIS — M2141 Flat foot [pes planus] (acquired), right foot: Secondary | ICD-10-CM | POA: Diagnosis not present

## 2021-08-13 DIAGNOSIS — E1142 Type 2 diabetes mellitus with diabetic polyneuropathy: Secondary | ICD-10-CM

## 2021-08-13 DIAGNOSIS — L84 Corns and callosities: Secondary | ICD-10-CM

## 2021-08-13 DIAGNOSIS — M2010 Hallux valgus (acquired), unspecified foot: Secondary | ICD-10-CM | POA: Diagnosis not present

## 2021-08-13 DIAGNOSIS — M2142 Flat foot [pes planus] (acquired), left foot: Secondary | ICD-10-CM | POA: Diagnosis not present

## 2021-08-13 NOTE — Progress Notes (Signed)
SITUATION ?Reason for Visit: Fitting of Diabetic Shoes & Insoles ?Patient / Caregiver Report:  Patient is satisfied with fit and function of shoes and insoles. ? ?OBJECTIVE DATA: ?Patient History / Diagnosis:   ?  ICD-10-CM   ?1. Diabetic peripheral neuropathy associated with type 2 diabetes mellitus (HCC)  E11.42   ?  ?2. Pes planus of both feet  M21.41   ? M21.42   ?  ?3. Hallux abductovalgus with bunions, unspecified laterality  M20.10   ? M21.619   ?  ?4. Pre-ulcerative calluses  L84   ?  ? ? ?Change in Status:   None ? ?ACTIONS PERFORMED: ?In-Person Delivery, patient was fit with: ?- 1x pair A5500 PDAC approved prefabricated Diabetic Shoes: Apex [id number] ?- 3x pair A5513 PDAC approved vacuum formed custom diabetic insoles; [employer]: [id number] ? ?Shoes and insoles were verified for structural integrity and safety. Patient wore shoes and insoles in office. Skin was inspected and free of areas of concern after wearing shoes and inserts. Shoes and inserts fit properly. Patient / Caregiver provided with ferbal instruction and demonstration regarding donning, doffing, wear, care, proper fit, function, purpose, cleaning, and use of shoes and insoles ' and in all related precautions and risks and benefits regarding shoes and insoles. Patient / Caregiver was instructed to wear properly fitting socks with shoes at all times. Patient was also provided with verbal instruction regarding how to report any failures or malfunctions of shoes or inserts, and necessary follow up care. Patient / Caregiver was also instructed to contact physician regarding change in status that may affect function of shoes and inserts.  ? ?Patient / Caregiver verbalized undersatnding of instruction provided. Patient / Caregiver demonstrated independence with proper donning and doffing of shoes and inserts. ? ?PLAN ?Patient to follow with treating physician as recommended. Plan of care was discussed with and agreed upon by patient and/or caregiver.  All questions were answered and concerns addressed. ? ?

## 2021-08-25 ENCOUNTER — Ambulatory Visit (INDEPENDENT_AMBULATORY_CARE_PROVIDER_SITE_OTHER): Payer: Medicare HMO | Admitting: Podiatry

## 2021-08-25 DIAGNOSIS — L84 Corns and callosities: Secondary | ICD-10-CM | POA: Diagnosis not present

## 2021-08-25 DIAGNOSIS — E1142 Type 2 diabetes mellitus with diabetic polyneuropathy: Secondary | ICD-10-CM

## 2021-08-27 NOTE — Progress Notes (Signed)
Subjective: ?61 year old female presents to the office today for concerns of calluses to both of her feet.  She states that been hurting quite a bit recently so she had to get a pedicure over the weekend.  ? ?A1c 8.7 on 02/18/2021 ? ?Objective: ?AAO x3, NAD ?DP/PT pulses palpable bilaterally, CRT less than 3 seconds ?Sensation decreased with Semmes Weinstein monofilament ?Minimal hyperkeratotic lesions noted to bilateral fifth metatarsal bases as well as the fifth metatarsal head.  Left side is worse than right.  There is no underlying ulceration drainage or signs of infection.  No open lesions identified today.  No other areas of discomfort identified today.  MMT 5/5.  ?Decreased medial arch height.  Bunions present. ?No pain with calf compression, swelling, warmth, erythema ? ?Assessment: ?Preulcerative lesions, type 2 diabetes with neuropathy ? ?Plan: ?-All treatment options discussed with the patient including all alternatives, risks, complications.  ?-Sharply debrided lesions x4 without any complications or bleeding.  Recommend moisturizer and offloading daily.   ?-Continue offloading.  ?-Daily foot inspection. ?-Patient encouraged to call the office with any questions, concerns, change in symptoms.  ? ?Vivi Barrack DPM ? ? ?

## 2021-10-27 ENCOUNTER — Ambulatory Visit: Payer: Medicare HMO | Admitting: Podiatry

## 2021-10-27 ENCOUNTER — Ambulatory Visit: Payer: Medicare HMO

## 2021-10-27 DIAGNOSIS — E1149 Type 2 diabetes mellitus with other diabetic neurological complication: Secondary | ICD-10-CM

## 2021-10-27 DIAGNOSIS — L84 Corns and callosities: Secondary | ICD-10-CM | POA: Diagnosis not present

## 2021-10-27 DIAGNOSIS — M722 Plantar fascial fibromatosis: Secondary | ICD-10-CM

## 2021-10-27 DIAGNOSIS — E119 Type 2 diabetes mellitus without complications: Secondary | ICD-10-CM

## 2021-10-27 DIAGNOSIS — M898X7 Other specified disorders of bone, ankle and foot: Secondary | ICD-10-CM

## 2021-10-27 NOTE — Patient Instructions (Signed)
Get blood work done around July 3rd

## 2021-10-27 NOTE — Progress Notes (Signed)
SITUATION Reason for Consult: Follow-up with foot orthotics Patient / Caregiver Report: Patient still getting callusing on 5th styloids  OBJECTIVE DATA History / Diagnosis:    ICD-10-CM   1. Type II diabetes mellitus with HbA1C goal to be determined (HCC)  E11.9       Change in Pathology: None  ACTIONS PERFORMED Patient's equipment was checked for structural stability and fit. Reduced pressure on 5th styloids bilateral Device(s) intact and fit is excellent. All questions answered and concerns addressed.  PLAN Follow-up as needed (PRN). Plan of care discussed with and agreed upon by patient / caregiver.

## 2021-10-29 NOTE — Progress Notes (Addendum)
Subjective: 61 year old female presents to the office today for concerns of calluses to both of her feet.  She was consider surgical intervention given the pain.  She tried shoe modifications, offloading MPR debridements and moisturizers without any significant improvement.   A1c 8.3  Objective: AAO x3, NAD DP/PT pulses palpable bilaterally, CRT less than 3 seconds Sensation decreased with Semmes Weinstein monofilament Hyperkeratotic lesions noted to bilateral fifth metatarsal bases as well as the fifth metatarsal head.  Left side is worse than right.  There is no underlying ulceration drainage or signs of infection.  No open lesions identified today.  No other areas of discomfort identified today.  MMT 5/5.  Decreased medial arch height.  Bunions present. No pain with calf compression, swelling, warmth, erythema  Assessment: Preulcerative lesions, type 2 diabetes with neuropathy  Plan: -All treatment options discussed with the patient including all alternatives, risks, complications.  -Sharply debrided lesions x4 without any complications or bleeding.  Recommend moisturizer and offloading daily.   -Continue offloading.  Continue orthotics.  She has not followed privately there is any other types of inserts that can be made to help further offload this. -Discussed surgical invention with A1c to be below 8 before proceeding with surgery.  We will recheck next month. -Daily foot inspection. -Patient encouraged to call the office with any questions, concerns, change in symptoms.   Vivi Barrack DPM

## 2021-10-29 NOTE — Addendum Note (Signed)
Addended by: Ovid Curd R on: 10/29/2021 07:51 AM   Modules accepted: Level of Service

## 2021-11-21 ENCOUNTER — Ambulatory Visit: Payer: Medicare HMO | Admitting: Podiatry

## 2021-11-21 DIAGNOSIS — E119 Type 2 diabetes mellitus without complications: Secondary | ICD-10-CM | POA: Diagnosis not present

## 2021-11-21 DIAGNOSIS — L84 Corns and callosities: Secondary | ICD-10-CM

## 2021-11-21 DIAGNOSIS — E1142 Type 2 diabetes mellitus with diabetic polyneuropathy: Secondary | ICD-10-CM | POA: Diagnosis not present

## 2021-11-21 DIAGNOSIS — M898X7 Other specified disorders of bone, ankle and foot: Secondary | ICD-10-CM

## 2021-11-23 NOTE — Progress Notes (Signed)
Subjective: 61 year old female presents to the office today for concerns of calluses to both of her feet.  She also presents to discuss surgery.  She wants to proceed with this to help shave down the prominent bone however she is still hold off on this for now she has other issues that she can go to work for.  No swelling redness or any drainage or any open lesions at this time.  No other concerns.   A1c 8.3-not been rechecked yet.  Objective: AAO x3, NAD DP/PT pulses palpable bilaterally, CRT less than 3 seconds Sensation decreased with Semmes Weinstein monofilament Hyperkeratotic lesions noted to bilateral fifth metatarsal bases as well as the fifth metatarsal head.  Left side is worse than right.  There is no underlying ulceration drainage or signs of infection.  No open lesions identified today.  No other areas of discomfort identified today.  MMT 5/5.  Decreased medial arch height.  Bunions present. No pain with calf compression, swelling, warmth, erythema  Assessment: Preulcerative lesions, type 2 diabetes with neuropathy  Plan: -All treatment options discussed with the patient including all alternatives, risks, complications.  -As a courtesy I sharply debrided lesions x4 without any complications or bleeding.  Recommend moisturizer and offloading daily.  Also discussed surgical intervention she was to proceed with this but we need to have the blood sugar better controlled and she like to hold off on this until later in the year.  Return in about 3 months (around 02/21/2022).  Vivi Barrack DPM

## 2022-02-23 ENCOUNTER — Ambulatory Visit (INDEPENDENT_AMBULATORY_CARE_PROVIDER_SITE_OTHER): Payer: Medicare HMO | Admitting: Podiatry

## 2022-02-23 DIAGNOSIS — M778 Other enthesopathies, not elsewhere classified: Secondary | ICD-10-CM

## 2022-02-23 DIAGNOSIS — M2142 Flat foot [pes planus] (acquired), left foot: Secondary | ICD-10-CM | POA: Diagnosis not present

## 2022-02-23 DIAGNOSIS — M2141 Flat foot [pes planus] (acquired), right foot: Secondary | ICD-10-CM

## 2022-02-23 NOTE — Progress Notes (Unsigned)
Subjective: 61 year old female presents to the office today for concerns of calluses to both of her feet.  She states they are painful.  She tries to have them shaved down to get a pedicure.  She is asking for steroid injection left foot is left foot is more painful than right.  Must consider surgical intervention in the future.  No recent injury or changes otherwise.  No new concerns.    A1c 8.3-not been rechecked yet.  Objective: AAO x3, NAD DP/PT pulses palpable bilaterally, CRT less than 3 seconds Sensation decreased with Semmes Weinstein monofilament Hyperkeratotic lesions noted to bilateral fifth metatarsal bases as well as the fifth metatarsal head.  Left side is worse than right.  There is no underlying ulceration drainage or signs of infection.  No open lesions identified today.  No other areas of discomfort identified today.  MMT 5/5.  Decreased medial arch height.  Bunions present. No pain with calf compression, swelling, warmth, erythema  Assessment: Preulcerative lesions, capsulitis left foot  Plan: -All treatment options discussed with the patient including all alternatives, risks, complications.  -Steroid injection performed the left fifth metatarsal base.  Discussed risks.  Skin cleaned with alcohol and mixture of quarter cc dexamethasone phosphate, quarter cc Marcaine plain was infiltrated the fifth metatarsal base on the metatarsal cuneiform joint away from the area of the peroneal tendon without complications.  Postinjection care discussed.  Tolerated well. -As a courtesy I sharply debrided lesions x4 without any complications or bleeding.  Recommend moisturizer and offloading daily.  Also discussed surgical intervention she was to proceed with this but we need to have the blood sugar better controlled and she like to hold off on this until later in the year.  We discussed different surgical options given her flatfoot.  Trula Slade DPM

## 2022-03-31 ENCOUNTER — Emergency Department (HOSPITAL_BASED_OUTPATIENT_CLINIC_OR_DEPARTMENT_OTHER): Payer: Medicare HMO

## 2022-03-31 ENCOUNTER — Other Ambulatory Visit: Payer: Self-pay

## 2022-03-31 ENCOUNTER — Encounter (HOSPITAL_BASED_OUTPATIENT_CLINIC_OR_DEPARTMENT_OTHER): Payer: Self-pay

## 2022-03-31 ENCOUNTER — Emergency Department (HOSPITAL_BASED_OUTPATIENT_CLINIC_OR_DEPARTMENT_OTHER): Payer: Medicare HMO | Admitting: Radiology

## 2022-03-31 ENCOUNTER — Emergency Department (HOSPITAL_BASED_OUTPATIENT_CLINIC_OR_DEPARTMENT_OTHER)
Admission: EM | Admit: 2022-03-31 | Discharge: 2022-03-31 | Disposition: A | Discharge status: Home / Self Care | Payer: Medicare HMO | Attending: Emergency Medicine | Admitting: Emergency Medicine

## 2022-03-31 DIAGNOSIS — M47816 Spondylosis without myelopathy or radiculopathy, lumbar region: Secondary | ICD-10-CM | POA: Insufficient documentation

## 2022-03-31 DIAGNOSIS — R102 Pelvic and perineal pain: Secondary | ICD-10-CM | POA: Insufficient documentation

## 2022-03-31 DIAGNOSIS — M545 Low back pain, unspecified: Secondary | ICD-10-CM | POA: Insufficient documentation

## 2022-03-31 DIAGNOSIS — W010XXA Fall on same level from slipping, tripping and stumbling without subsequent striking against object, initial encounter: Secondary | ICD-10-CM | POA: Insufficient documentation

## 2022-03-31 DIAGNOSIS — S4351XA Sprain of right acromioclavicular joint, initial encounter: Secondary | ICD-10-CM | POA: Diagnosis not present

## 2022-03-31 DIAGNOSIS — Z7984 Long term (current) use of oral hypoglycemic drugs: Secondary | ICD-10-CM | POA: Insufficient documentation

## 2022-03-31 DIAGNOSIS — I700 Atherosclerosis of aorta: Secondary | ICD-10-CM | POA: Insufficient documentation

## 2022-03-31 DIAGNOSIS — Z7982 Long term (current) use of aspirin: Secondary | ICD-10-CM | POA: Diagnosis not present

## 2022-03-31 DIAGNOSIS — Z794 Long term (current) use of insulin: Secondary | ICD-10-CM | POA: Diagnosis not present

## 2022-03-31 DIAGNOSIS — M4316 Spondylolisthesis, lumbar region: Secondary | ICD-10-CM | POA: Insufficient documentation

## 2022-03-31 DIAGNOSIS — M25511 Pain in right shoulder: Secondary | ICD-10-CM | POA: Diagnosis present

## 2022-03-31 DIAGNOSIS — W19XXXA Unspecified fall, initial encounter: Secondary | ICD-10-CM

## 2022-03-31 MED ORDER — HYDROCODONE-ACETAMINOPHEN 5-325 MG PO TABS
1.0000 | ORAL_TABLET | Freq: Once | ORAL | Status: AC
  Administered 2022-03-31: 1 via ORAL
  Filled 2022-03-31: qty 1

## 2022-03-31 MED ORDER — CYCLOBENZAPRINE HCL 10 MG PO TABS: 10.0000 mg | ORAL_TABLET | Freq: Two times a day (BID) | ORAL | 0 refills | Status: AC | PRN

## 2022-03-31 MED ORDER — LIDOCAINE 5 % EX PTCH: 1.0000 | MEDICATED_PATCH | CUTANEOUS | 0 refills | Status: AC

## 2022-03-31 NOTE — ED Provider Notes (Addendum)
[facility] EMERGENCY DEPT Provider Note   CSN: [id number] Arrival date & time: 03/31/22  1700     History  Chief Complaint  Patient presents with   Ashley Crane Ashley Crane is a 61 y.o. female.   Fall     61 year old female presenting to the emergency department after mechanical fall yesterday.  The patient states that she tripped over boxes yesterday while at [employer].  She landed on her tailbone.  She endorses pain in her lower lumbar spine.  She additionally endorses pain in her right shoulder.  She states that she did not lose consciousness.  She did hit her head mildly on the floor but denies any headache or pain.  She denies any neck pain.  Is not on anticoagulation.  She denies any other injuries or complaints.  She arrived to the emerged department GCS 15, ABC intact.  Home Medications Prior to Admission medications   Medication Sig Start Date End Date Taking? Authorizing Provider  cyclobenzaprine (FLEXERIL) 10 MG tablet Take 1 tablet (10 mg total) by mouth 2 (two) times daily as needed for muscle spasms. 03/31/22  Yes Ernie Avena, MD  lidocaine (LIDODERM) 5 % Place 1 patch onto the skin daily. Remove & Discard patch within 12 hours or as directed by MD 03/31/22  Yes Ernie Avena, MD  aspirin 81 MG chewable tablet Chew 81 mg by mouth daily. 08/21/14   [provider]  atorvastatin (LIPITOR) 40 MG tablet Take 40 mg by mouth daily.    [provider]  Continuous Blood Gluc Sensor (FREESTYLE LIBRE 14 DAY SENSOR) MISC USE AS DIRECTED TO CHECK BLOOD SUGAR 02/26/18   [provider]  diclofenac Sodium (VOLTAREN) 1 % GEL Apply 2 g topically 4 (four) times daily. Rub into affected area of foot 2 to 4 times daily 09/15/19   Vivi Barrack, DPM  Insulin Glargine-Lixisenatide 100-33 UNT-MCG/ML SOPN Inject 60 Units into the skin daily.    [provider]  metFORMIN (GLUCOPHAGE) 1000 MG tablet Take 1,000 mg by mouth 2 (two) times  daily. 03/15/18   [provider]  omeprazole (PRILOSEC) 40 MG capsule omeprazole 40 mg capsule,delayed release  TAKE ONE CAPSULE BY MOUTH 20 MINUTES BEFORE BREAKFAST FOR 90 DAYS    [provider]  PAXLOVID, 300/100, 20 x 150 MG & 10 x 100MG  TBPK Take by mouth. 12/17/20   [provider]  valsartan-hydrochlorothiazide (DIOVAN-HCT) 320-25 MG per tablet Take 1 tablet by mouth daily.    [provider]      Allergies    Oxycodone hcl    Review of Systems   Review of Systems  All other systems reviewed and are negative.   Physical Exam Updated Vital Signs BP (!) 169/71   Pulse (!) 58   Temp 98 F (36.7 C) (Temporal)   Resp 16   Ht 5\' 4"  (1.626 m)   Wt 90.3 kg   SpO2 100%   BMI 34.17 kg/m  Physical Exam Vitals and nursing note reviewed.  Constitutional:      General: She is not in acute distress.    Appearance: She is well-developed.     Comments: GCS 15, ABC intact  HENT:     Head: Normocephalic and atraumatic.  Eyes:     Extraocular Movements: Extraocular movements intact.     Conjunctiva/sclera: Conjunctivae normal.     Pupils: Pupils are equal, round, and reactive to light.  Neck:     Comments: No  midline tenderness to palpation of the cervical spine.  Range of motion intact Cardiovascular:     Rate and Rhythm: Normal rate and regular rhythm.  Pulmonary:     Effort: Pulmonary effort is normal. No respiratory distress.     Breath sounds: Normal breath sounds.  Chest:     Comments: Clavicles stable nontender to AP compression.  Chest wall stable and nontender to AP and lateral compression. Abdominal:     Palpations: Abdomen is soft.     Tenderness: There is no abdominal tenderness.     Comments: Pelvis stable to lateral compression  Musculoskeletal:     Cervical back: Neck supple.     Comments: No midline tenderness to palpation of the thoracic spine.  Midline tenderness to palpation of the lumbar spine, down to the sacrum and  pelvis bilaterally. Extremities atraumatic with intact range of motion with the exception of mild pain with range of motion of the right shoulder, tenderness of the AC joint on the right, distally neurovascularly intact  Skin:    General: Skin is warm and dry.  Neurological:     Mental Status: She is alert.     Comments: Cranial nerves II through XII grossly intact.  Moving all 4 extremities spontaneously.  Sensation grossly intact all 4 extremities     ED Results / Procedures / Treatments   Labs (all labs ordered are listed, but only abnormal results are displayed) Labs Reviewed - No data to display  EKG None  Radiology CT Lumbar Spine Wo Contrast  Result Date: 03/31/2022 CLINICAL DATA:  Low back pain radiating to the left leg after fall yesterday. EXAM: CT LUMBAR SPINE WITHOUT CONTRAST TECHNIQUE: Multidetector CT imaging of the lumbar spine was performed without intravenous contrast administration. Multiplanar CT image reconstructions were also generated. RADIATION DOSE REDUCTION: This exam was performed according to the departmental dose-optimization program which includes automated exposure control, adjustment of the mA and/or kV according to patient size and/or use of iterative reconstruction technique. COMPARISON:  Lumbar spine x-rays from same day. CT abdomen pelvis dated December 27, 2020. FINDINGS: Segmentation: In keeping with the prior numbering system, the last well-formed disc space is labeled L5-S1. Alignment: 2 mm anterolisthesis at L4-L5. Trace anterolisthesis at L5-S1. Vertebrae: No acute fracture or focal pathologic process. Paraspinal and other soft tissues: Aortoiliac atherosclerotic vascular disease. Disc levels: Multilevel degenerative changes. Severe facet arthropathy at L4-L5 and L5-S1. Postsurgical changes at L4-L5. Mild spinal canal stenosis at L3-L4. No neuroforaminal stenosis at any level. IMPRESSION: 1. No acute lumbar spine fracture. 2. Multilevel degenerative changes  as described above. 3.  Aortic Atherosclerosis (ICD10-I70.0). Electronically Signed   By: Obie Dredge M.D.   On: 03/31/2022 20:59   CT PELVIS WO CONTRAST  Result Date: 03/31/2022 CLINICAL DATA:  Fall. EXAM: CT PELVIS WITHOUT CONTRAST TECHNIQUE: Multidetector CT imaging of the pelvis was performed following the standard protocol without intravenous contrast. RADIATION DOSE REDUCTION: This exam was performed according to the departmental dose-optimization program which includes automated exposure control, adjustment of the mA and/or kV according to patient size and/or use of iterative reconstruction technique. COMPARISON:  CT abdomen pelvis dated 12/27/2020. FINDINGS: Evaluation of this exam is limited in the absence of intravenous contrast. Urinary Tract: The visualized ureters and urinary bladder appear unremarkable. Bowel: There is moderate stool throughout the visualized colon. The visualized small bowel loops are unremarkable. The appendix is normal. Vascular/Lymphatic: Mild aortoiliac atherosclerotic disease. The IVC is grossly unremarkable. No pelvic adenopathy. Mild diffuse haziness of the  mesentery, nonspecific. Reproductive: The uterus is grossly unremarkable. No adnexal masses. Other:  None Musculoskeletal: Multilevel facet arthropathy. No acute osseous pathology. IMPRESSION: 1. No acute/traumatic pelvic pathology. 2. Moderate stool throughout the visualized colon. 3.  Aortic Atherosclerosis (ICD10-I70.0). Electronically Signed   By: Elgie Collard M.D.   On: 03/31/2022 20:49   DG Lumbar Spine 2-3 Views  Result Date: 03/31/2022 CLINICAL DATA:  Fall yesterday with lower back pain radiating to left leg. EXAM: LUMBAR SPINE - 2-3 VIEW COMPARISON:  Lumbar spine radiographs 10/16/2008 and CT abdomen/pelvis 07/18/2014 FINDINGS: There are 4 non-rib-bearing lumbar-type vertebral bodies. For the purposes of this report, the lowest rib-bearing vertebral body with a global designated L1. Vertebral body  heights are preserved, without evidence of acute injury. There is trace anterolisthesis of L4 on L5, similar to the prior CT. There is no evidence of traumatic malalignment or spondylolysis. The disc heights are overall preserved in the lumbar spine. There is mild degenerative endplate change in the lower thoracic spine. There is moderate facet arthropathy at L4-L5. The SI joints are intact. The soft tissues are unremarkable. Right upper quadrant surgical clips are noted. IMPRESSION: 1. No evidence of acute injury in the lumbar spine. 2. Grade 1 anterolisthesis of L4 on L5 with associated moderate facet arthropathy. Electronically Signed   By: Lesia Hausen M.D.   On: 03/31/2022 17:55   DG Shoulder Right  Result Date: 03/31/2022 CLINICAL DATA:  Right shoulder pain after fall yesterday. EXAM: RIGHT SHOULDER - 2+ VIEW COMPARISON:  Right clavicle x-rays dated Sep 29, 2010. FINDINGS: No acute fracture or dislocation. Progressive moderate acromioclavicular joint osteoarthritis. The glenohumeral joint space is preserved. Soft tissues are unremarkable. IMPRESSION: 1. No acute osseous abnormality. Electronically Signed   By: Obie Dredge M.D.   On: 03/31/2022 17:54    Procedures Procedures    Medications Ordered in ED Medications  HYDROcodone-acetaminophen (NORCO/VICODIN) 5-325 MG per tablet 1 tablet (1 tablet Oral Given 03/31/22 2007)    ED Course/ Medical Decision Making/ A&P                           Medical Decision Making Amount and/or Complexity of Data Reviewed Radiology: ordered.  Risk Prescription drug management.     61 year old female presenting to the emergency department after mechanical fall yesterday.  The patient states that she tripped over boxes yesterday while at [employer].  She landed on her tailbone.  She endorses pain in her lower lumbar spine.  She additionally endorses pain in her right shoulder.  She states that she did not lose consciousness.  She did hit her head  mildly on the floor but denies any headache or pain.  She denies any neck pain.  Is not on anticoagulation.  She denies any other injuries or complaints.  She arrived to the emerged department GCS 15, ABC intact.  On arrival, the patient was vitally stable.  Physical exam as per above with tenderness of the right shoulder, right AC joint, midline tenderness to palpation of the lumbar spine, down to the sacrum and pelvis bilaterally.  Initial x-ray imaging of the right shoulder and lumbar spine negative for acute fracture or dislocation.  Patient presenting with tenderness of the [facility] joint on the right, also some pain with range of motion of the shoulder on the right.  Considered ligamentous injury versus AC sprain.  The patient was placed in a sling for comfort.  We will have the patient follow-up  outpatient with sports medicine.  Additionally, the patient presents with midline tenderness to palpation of the lumbar spine, down to the sacrum and pelvis bilaterally.  CT imaging of the lumbar spine and pelvis was ordered evaluate for fracture.  Patient was administered Norco for pain control.  CT imaging resulted negative for acute traumatic injuries.  Patient ambulatory, recommended Tylenol ibuprofen for pain control, lidocaine patch, Flexeril as needed.  Stable for discharge.  Final Clinical Impression(s) / ED Diagnoses Final diagnoses:  Fall, initial encounter  Sprain of right acromioclavicular ligament, initial encounter    Rx / DC Orders ED Discharge Orders          Ordered    AMB referral to sports medicine        03/31/22 2110    cyclobenzaprine (FLEXERIL) 10 MG tablet  2 times daily PRN        03/31/22 2111    lidocaine (LIDODERM) 5 %  Every 24 hours        03/31/22 2111              Ernie AvenaLawsing, Yeva Bissette, MD 03/31/22 2111    Ernie AvenaLawsing, Dara Camargo, MD 04/08/22 1429    Ernie AvenaLawsing, Chandrea Zellman, MD 04/08/22 1432

## 2022-03-31 NOTE — ED Triage Notes (Addendum)
Patient here POV from Home.  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 Endorses Pain to Lower Back and Right Shoulder. Lower Back Pain Radiates to Left Leg.   Head injury to Floor. No LOC. No Anticoagulants.   NAD Noted during Triage. A&Ox4. GCS 15. Ambulatory.

## 2022-03-31 NOTE — Discharge Instructions (Addendum)
Your CT imaging did not reveal evidence of acute fracture.  You potentially have a sprain of your right AC joint versus a ligamentous injury in your right shoulder.  Recommend outpatient sports medicine follow-up.  Maintain a sling for comfort until then.  Commend Tylenol and ibuprofen for pain control.  Also try lidocaine patch and Flexeril as needed.

## 2022-04-28 ENCOUNTER — Ambulatory Visit: Payer: Medicare HMO | Admitting: Podiatry

## 2022-04-30 ENCOUNTER — Ambulatory Visit: Payer: Medicare HMO | Admitting: Podiatry

## 2022-05-26 ENCOUNTER — Ambulatory Visit: Payer: Medicare HMO | Admitting: Podiatry

## 2022-05-28 ENCOUNTER — Ambulatory Visit: Payer: Medicare HMO | Admitting: Podiatry

## 2022-09-28 ENCOUNTER — Other Ambulatory Visit (HOSPITAL_BASED_OUTPATIENT_CLINIC_OR_DEPARTMENT_OTHER): Payer: Self-pay

## 2022-09-28 ENCOUNTER — Other Ambulatory Visit (HOSPITAL_COMMUNITY): Payer: Self-pay

## 2022-09-28 MED ORDER — MOUNJARO 5 MG/0.5ML ~~LOC~~ SOAJ
5.0000 mg | SUBCUTANEOUS | 4 refills | Status: DC
  Filled 2022-09-28 (×2): qty 2, 28d supply, fill #0
  Filled 2022-10-19: qty 2, 28d supply, fill #1
  Filled 2022-11-20: qty 2, 28d supply, fill #2

## 2022-10-19 ENCOUNTER — Other Ambulatory Visit (HOSPITAL_COMMUNITY): Payer: Self-pay

## 2022-10-20 ENCOUNTER — Other Ambulatory Visit (HOSPITAL_COMMUNITY): Payer: Self-pay

## 2022-11-20 ENCOUNTER — Other Ambulatory Visit (HOSPITAL_COMMUNITY): Payer: Self-pay

## 2022-11-24 ENCOUNTER — Other Ambulatory Visit (HOSPITAL_COMMUNITY): Payer: Self-pay

## 2022-11-24 ENCOUNTER — Other Ambulatory Visit (HOSPITAL_BASED_OUTPATIENT_CLINIC_OR_DEPARTMENT_OTHER): Payer: Self-pay

## 2022-11-24 MED ORDER — MOUNJARO 7.5 MG/0.5ML ~~LOC~~ SOAJ
7.5000 mg | SUBCUTANEOUS | 2 refills | Status: DC
  Filled 2022-11-24 (×4): qty 2, 28d supply, fill #0
  Filled 2022-12-18 (×4): qty 2, 28d supply, fill #1
  Filled 2023-01-07 – 2023-01-08 (×2): qty 2, 28d supply, fill #2

## 2022-11-26 ENCOUNTER — Other Ambulatory Visit (HOSPITAL_BASED_OUTPATIENT_CLINIC_OR_DEPARTMENT_OTHER): Payer: Self-pay

## 2022-11-26 MED ORDER — HYDROCODONE BIT-HOMATROP MBR 5-1.5 MG/5ML PO SOLN
5.0000 mL | ORAL | 0 refills | Status: AC | PRN
  Filled 2022-11-26: qty 200, 7d supply, fill #0

## 2022-11-26 MED ORDER — AZITHROMYCIN 250 MG PO TABS
ORAL_TABLET | ORAL | 0 refills | Status: AC
  Filled 2022-11-26: qty 6, 5d supply, fill #0

## 2022-11-30 ENCOUNTER — Other Ambulatory Visit (HOSPITAL_BASED_OUTPATIENT_CLINIC_OR_DEPARTMENT_OTHER): Payer: Self-pay

## 2022-11-30 ENCOUNTER — Other Ambulatory Visit: Payer: Self-pay

## 2022-11-30 ENCOUNTER — Emergency Department (HOSPITAL_BASED_OUTPATIENT_CLINIC_OR_DEPARTMENT_OTHER)
Admission: EM | Admit: 2022-11-30 | Discharge: 2022-11-30 | Disposition: A | Discharge status: Home / Self Care | Payer: Medicare PPO | Attending: Emergency Medicine | Admitting: Emergency Medicine

## 2022-11-30 ENCOUNTER — Encounter (HOSPITAL_BASED_OUTPATIENT_CLINIC_OR_DEPARTMENT_OTHER): Payer: Self-pay | Admitting: Emergency Medicine

## 2022-11-30 DIAGNOSIS — Z7982 Long term (current) use of aspirin: Secondary | ICD-10-CM | POA: Diagnosis not present

## 2022-11-30 DIAGNOSIS — Z794 Long term (current) use of insulin: Secondary | ICD-10-CM | POA: Diagnosis not present

## 2022-11-30 DIAGNOSIS — U071 COVID-19: Secondary | ICD-10-CM | POA: Diagnosis not present

## 2022-11-30 DIAGNOSIS — R067 Sneezing: Secondary | ICD-10-CM | POA: Diagnosis present

## 2022-11-30 LAB — SARS CORONAVIRUS 2 BY RT PCR: SARS Coronavirus 2 by RT PCR: POSITIVE — AB

## 2022-11-30 MED ORDER — MOLNUPIRAVIR EUA 200MG CAPSULE
4.0000 | ORAL_CAPSULE | Freq: Two times a day (BID) | ORAL | 0 refills | Status: AC
  Filled 2022-11-30: qty 40, 5d supply, fill #0

## 2022-11-30 MED ORDER — PAXLOVID (300/100) 20 X 150 MG & 10 X 100MG PO TBPK
ORAL_TABLET | ORAL | 0 refills | Status: DC
  Filled 2022-11-30: qty 30, 5d supply, fill #0

## 2022-11-30 NOTE — ED Provider Notes (Signed)
[place] EMERGENCY DEPARTMENT AT [facility] Provider Note   CSN: [id number] Arrival date & time: 11/30/22  2440     History  Chief Complaint  Patient presents with   Sneezing   Runny Nose    Ashley Crane is a 62 y.o. female.  62 year old female presents today for evaluation of persistent URI symptoms.  Recently diagnosed with strep throat.  Completed antibiotic course.  Reports resolution of pharyngitis.  States she was recently around someone with URI symptoms and is concerned she may have picked up COVID.  Would like COVID test.  Denies chest pain, shortness of breath.  The history is provided by the patient. No language interpreter was used.       Home Medications Prior to Admission medications   Medication Sig Start Date End Date Taking? Authorizing Provider  aspirin 81 MG chewable tablet Chew 81 mg by mouth daily. 08/21/14   [provider]  atorvastatin (LIPITOR) 40 MG tablet Take 40 mg by mouth daily.    [provider]  azithromycin (ZITHROMAX Z-PAK) 250 MG tablet Take 2 tablets (500 mg) by mouth on day 1, then 1 tablet (250 mg) by mouth for 4 days 11/26/22     Continuous Blood Gluc Sensor (FREESTYLE LIBRE 14 DAY SENSOR) MISC USE AS DIRECTED TO CHECK BLOOD SUGAR 02/26/18   [provider]  cyclobenzaprine (FLEXERIL) 10 MG tablet Take 1 tablet (10 mg total) by mouth 2 (two) times daily as needed for muscle spasms. 03/31/22   Ernie Avena, MD  diclofenac Sodium (VOLTAREN) 1 % GEL Apply 2 g topically 4 (four) times daily. Rub into affected area of foot 2 to 4 times daily 09/15/19   Vivi Barrack, DPM  HYDROcodone bit-homatropine (HYCODAN) 5-1.5 MG/5ML syrup Take 5 mLs by mouth every 4 (four) hours as needed. 11/26/22     Insulin Glargine-Lixisenatide 100-33 UNT-MCG/ML SOPN Inject 60 Units into the skin daily.    [provider]  lidocaine (LIDODERM) 5 % Place 1 patch onto the skin daily. Remove & Discard patch within 12  hours or as directed by MD 03/31/22   Ernie Avena, MD  metFORMIN (GLUCOPHAGE) 1000 MG tablet Take 1,000 mg by mouth 2 (two) times daily. 03/15/18   [provider]  omeprazole (PRILOSEC) 40 MG capsule omeprazole 40 mg capsule,delayed release  TAKE ONE CAPSULE BY MOUTH 20 MINUTES BEFORE BREAKFAST FOR 90 DAYS    [provider]  PAXLOVID, 300/100, 20 x 150 MG & 10 x 100MG  TBPK Take by mouth. 12/17/20   [provider]  tirzepatide Greggory Keen) 7.5 MG/0.5ML Pen Inject 7.5 mg into the skin once a week. 11/24/22     valsartan-hydrochlorothiazide (DIOVAN-HCT) 320-25 MG per tablet Take 1 tablet by mouth daily.    [provider]      Allergies    Oxycodone hcl    Review of Systems   Review of Systems  Constitutional:  Negative for activity change, chills and fever.  HENT:  Positive for congestion and postnasal drip. Negative for sinus pressure, sore throat, trouble swallowing and voice change.   Eyes:  Negative for visual disturbance.  Respiratory:  Positive for cough. Negative for shortness of breath.   Cardiovascular:  Negative for chest pain.  Neurological:  Negative for light-headedness.  All other systems reviewed and are negative.   Physical Exam Updated Vital Signs BP (!) 203/80 (BP Location: Right Arm)   Pulse 66   Temp 98 F (36.7 C) (Temporal)   Resp  18   Ht 5\' 4"  (1.626 m)   Wt 81.6 kg   SpO2 100%   BMI 30.90 kg/m  Physical Exam Vitals and nursing note reviewed.  Constitutional:      General: She is not in acute distress.    Appearance: Normal appearance. She is not ill-appearing.  HENT:     Head: Normocephalic and atraumatic.     Right Ear: Tympanic membrane normal.     Left Ear: Tympanic membrane normal.     Nose: Nose normal.     Mouth/Throat:     Mouth: Mucous membranes are moist.     Pharynx: No posterior oropharyngeal erythema.  Eyes:     General: No scleral icterus.    Extraocular Movements: Extraocular movements intact.      Conjunctiva/sclera: Conjunctivae normal.  Cardiovascular:     Rate and Rhythm: Normal rate and regular rhythm.     Heart sounds: Normal heart sounds.  Pulmonary:     Effort: Pulmonary effort is normal. No respiratory distress.     Breath sounds: Normal breath sounds. No wheezing or rales.  Abdominal:     General: There is no distension.     Tenderness: There is no abdominal tenderness.  Musculoskeletal:        General: Normal range of motion.     Cervical back: Normal range of motion.  Skin:    General: Skin is warm and dry.  Neurological:     General: No focal deficit present.     Mental Status: She is alert. Mental status is at baseline.     ED Results / Procedures / Treatments   Labs (all labs ordered are listed, but only abnormal results are displayed) Labs Reviewed  SARS CORONAVIRUS 2 BY RT PCR    EKG None  Radiology No results found.  Procedures Procedures    Medications Ordered in ED Medications - No data to display  ED Course/ Medical Decision Making/ A&P                             Medical Decision Making  62 year old female presents today with URI symptoms.  Recently diagnosed with strep and completed 5-day course of Zithromax.  Would like COVID test.  Exam overall reassuring.  Lung sounds are clear.  Hypertensive.  Did not take her morning antihypertensives.  Discussed course of taking her antihypertensive and follow-up with PCP for better blood pressure control.  She is without chest pain, shortness of breath, vision change, or balance issues.  COVID test positive.  Molnupiravir prescribed.  Return precautions discussed.  Patient voices understanding and is in agreement with plan.  Quarantine information given.    Final Clinical Impression(s) / ED Diagnoses Final diagnoses:  COVID-19    Rx / DC Orders ED Discharge Orders          Ordered    molnupiravir EUA (LAGEVRIO) 200 mg CAPS capsule  2 times daily        11/30/22 0936               Marita [place], PA-C 11/30/22 1610    Cathren Laine, MD 11/30/22 1007

## 2022-11-30 NOTE — Discharge Instructions (Addendum)
Your COVID test was positive.  I have prescribed you molnupiravir.  For any concerning symptoms return to the emergency department otherwise follow-up with your primary care provider.  Sinus rinse information attached above.  Your blood pressure is high.  Take your home medications.  Follow-up with your primary care provider.

## 2022-11-30 NOTE — ED Triage Notes (Signed)
Pt arrives to ED with c/o x5 days of rhinorrhea, sneezing, and feeling unwell. Was dx with strep throat x4 days ago and took last antibiotic today.

## 2022-12-18 ENCOUNTER — Other Ambulatory Visit (HOSPITAL_BASED_OUTPATIENT_CLINIC_OR_DEPARTMENT_OTHER): Payer: Self-pay

## 2022-12-18 ENCOUNTER — Other Ambulatory Visit: Payer: Self-pay

## 2022-12-21 ENCOUNTER — Other Ambulatory Visit (HOSPITAL_COMMUNITY): Payer: Self-pay

## 2023-01-07 ENCOUNTER — Other Ambulatory Visit (HOSPITAL_BASED_OUTPATIENT_CLINIC_OR_DEPARTMENT_OTHER): Payer: Self-pay

## 2023-01-08 ENCOUNTER — Other Ambulatory Visit (HOSPITAL_BASED_OUTPATIENT_CLINIC_OR_DEPARTMENT_OTHER): Payer: Self-pay

## 2023-01-08 ENCOUNTER — Other Ambulatory Visit: Payer: Self-pay

## 2023-02-03 ENCOUNTER — Other Ambulatory Visit (HOSPITAL_BASED_OUTPATIENT_CLINIC_OR_DEPARTMENT_OTHER): Payer: Self-pay

## 2023-02-03 MED ORDER — MOUNJARO 7.5 MG/0.5ML ~~LOC~~ SOAJ
7.5000 mg | SUBCUTANEOUS | 2 refills | Status: AC
  Filled 2023-02-03 – 2023-02-22 (×2): qty 2, 28d supply, fill #0

## 2023-02-04 ENCOUNTER — Other Ambulatory Visit: Payer: Self-pay

## 2023-02-04 ENCOUNTER — Other Ambulatory Visit (HOSPITAL_BASED_OUTPATIENT_CLINIC_OR_DEPARTMENT_OTHER): Payer: Self-pay

## 2023-02-08 ENCOUNTER — Other Ambulatory Visit (HOSPITAL_BASED_OUTPATIENT_CLINIC_OR_DEPARTMENT_OTHER): Payer: Self-pay

## 2023-02-11 ENCOUNTER — Other Ambulatory Visit (HOSPITAL_BASED_OUTPATIENT_CLINIC_OR_DEPARTMENT_OTHER): Payer: Self-pay

## 2023-02-16 ENCOUNTER — Other Ambulatory Visit (HOSPITAL_BASED_OUTPATIENT_CLINIC_OR_DEPARTMENT_OTHER): Payer: Self-pay

## 2023-02-22 ENCOUNTER — Other Ambulatory Visit (HOSPITAL_BASED_OUTPATIENT_CLINIC_OR_DEPARTMENT_OTHER): Payer: Self-pay

## 2023-03-22 ENCOUNTER — Other Ambulatory Visit (HOSPITAL_BASED_OUTPATIENT_CLINIC_OR_DEPARTMENT_OTHER): Payer: Self-pay

## 2023-03-22 MED ORDER — NITROFURANTOIN MONOHYD MACRO 100 MG PO CAPS
100.0000 mg | ORAL_CAPSULE | Freq: Two times a day (BID) | ORAL | 0 refills | Status: DC
  Filled 2023-03-22: qty 14, 7d supply, fill #0

## 2023-03-30 ENCOUNTER — Other Ambulatory Visit (HOSPITAL_BASED_OUTPATIENT_CLINIC_OR_DEPARTMENT_OTHER): Payer: Self-pay

## 2023-03-30 MED ORDER — JARDIANCE 10 MG PO TABS
10.0000 mg | ORAL_TABLET | Freq: Every day | ORAL | 5 refills | Status: DC
  Filled 2023-03-30: qty 30, 30d supply, fill #0
  Filled 2023-06-12 (×2): qty 30, 30d supply, fill #1

## 2023-04-05 ENCOUNTER — Other Ambulatory Visit (HOSPITAL_BASED_OUTPATIENT_CLINIC_OR_DEPARTMENT_OTHER): Payer: Self-pay

## 2023-04-06 ENCOUNTER — Other Ambulatory Visit (HOSPITAL_BASED_OUTPATIENT_CLINIC_OR_DEPARTMENT_OTHER): Payer: Self-pay

## 2023-04-06 MED ORDER — CLOTRIMAZOLE-BETAMETHASONE 1-0.05 % EX CREA
1.0000 | TOPICAL_CREAM | Freq: Two times a day (BID) | CUTANEOUS | 1 refills | Status: DC
  Filled 2023-04-06: qty 45, 14d supply, fill #0

## 2023-04-06 MED ORDER — FLUCONAZOLE 100 MG PO TABS
100.0000 mg | ORAL_TABLET | Freq: Two times a day (BID) | ORAL | 1 refills | Status: AC
  Filled 2023-04-06: qty 14, 7d supply, fill #0

## 2023-04-29 ENCOUNTER — Other Ambulatory Visit (HOSPITAL_BASED_OUTPATIENT_CLINIC_OR_DEPARTMENT_OTHER): Payer: Self-pay

## 2023-04-29 MED ORDER — GLIPIZIDE 5 MG PO TABS
5.0000 mg | ORAL_TABLET | Freq: Two times a day (BID) | ORAL | 3 refills | Status: AC
  Filled 2023-04-29: qty 180, 90d supply, fill #0

## 2023-06-12 ENCOUNTER — Other Ambulatory Visit (HOSPITAL_BASED_OUTPATIENT_CLINIC_OR_DEPARTMENT_OTHER): Payer: Self-pay

## 2023-06-21 ENCOUNTER — Other Ambulatory Visit (HOSPITAL_BASED_OUTPATIENT_CLINIC_OR_DEPARTMENT_OTHER): Payer: Self-pay

## 2023-06-28 ENCOUNTER — Other Ambulatory Visit (HOSPITAL_BASED_OUTPATIENT_CLINIC_OR_DEPARTMENT_OTHER): Payer: Self-pay

## 2023-06-30 ENCOUNTER — Other Ambulatory Visit (HOSPITAL_BASED_OUTPATIENT_CLINIC_OR_DEPARTMENT_OTHER): Payer: Self-pay

## 2023-06-30 MED ORDER — SODIUM FLUORIDE 1.1 % DT PSTE
PASTE | DENTAL | 3 refills | Status: AC
  Filled 2023-06-30: qty 100, 30d supply, fill #0
  Filled 2023-08-12: qty 100, 30d supply, fill #1
